# Patient Record
Sex: Male | Born: 1935 | Race: White | Hispanic: No | Marital: Single | Smoking: Former smoker
Health system: Southern US, Community
[De-identification: ages and names within clinical notes are randomized; demographics above are authoritative.]

## PROBLEM LIST (undated history)

## (undated) DIAGNOSIS — I10 Essential (primary) hypertension: Secondary | ICD-10-CM

## (undated) DIAGNOSIS — R609 Edema, unspecified: Secondary | ICD-10-CM

## (undated) DIAGNOSIS — E876 Hypokalemia: Secondary | ICD-10-CM

## (undated) DIAGNOSIS — I639 Cerebral infarction, unspecified: Secondary | ICD-10-CM

## (undated) DIAGNOSIS — I2119 ST elevation (STEMI) myocardial infarction involving other coronary artery of inferior wall: Secondary | ICD-10-CM

## (undated) DIAGNOSIS — E785 Hyperlipidemia, unspecified: Secondary | ICD-10-CM

## (undated) DIAGNOSIS — K7030 Alcoholic cirrhosis of liver without ascites: Secondary | ICD-10-CM

## (undated) DIAGNOSIS — I2510 Atherosclerotic heart disease of native coronary artery without angina pectoris: Secondary | ICD-10-CM

## (undated) HISTORY — DX: Hypokalemia: E87.6

## (undated) HISTORY — DX: ST elevation (STEMI) myocardial infarction involving other coronary artery of inferior wall: I21.19

## (undated) HISTORY — DX: Atherosclerotic heart disease of native coronary artery without angina pectoris: I25.10

## (undated) HISTORY — PX: COLECTOMY: SHX59

## (undated) HISTORY — DX: Cerebral infarction, unspecified: I63.9

## (undated) HISTORY — PX: CHOLECYSTECTOMY: SHX55

## (undated) HISTORY — PX: PENILE PROSTHESIS IMPLANT: SHX240

## (undated) HISTORY — DX: Hyperlipidemia, unspecified: E78.5

## (undated) HISTORY — DX: Essential (primary) hypertension: I10

## (undated) HISTORY — PX: TONSILLECTOMY: SHX5217

## (undated) HISTORY — DX: Edema, unspecified: R60.9

---

## 1999-11-23 ENCOUNTER — Inpatient Hospital Stay (HOSPITAL_COMMUNITY): Admission: EM | Admit: 1999-11-23 | Discharge: 1999-11-30 | Payer: Self-pay | Admitting: Emergency Medicine

## 1999-11-23 ENCOUNTER — Encounter: Payer: Self-pay | Admitting: Cardiology

## 1999-11-25 ENCOUNTER — Encounter: Payer: Self-pay | Admitting: Cardiology

## 1999-12-23 DIAGNOSIS — I2119 ST elevation (STEMI) myocardial infarction involving other coronary artery of inferior wall: Secondary | ICD-10-CM

## 1999-12-23 HISTORY — PX: CARDIAC CATHETERIZATION: SHX172

## 1999-12-23 HISTORY — DX: ST elevation (STEMI) myocardial infarction involving other coronary artery of inferior wall: I21.19

## 2000-01-29 ENCOUNTER — Encounter (HOSPITAL_COMMUNITY): Admission: RE | Admit: 2000-01-29 | Discharge: 2000-04-28 | Payer: Self-pay | Admitting: Cardiology

## 2000-11-12 ENCOUNTER — Ambulatory Visit (HOSPITAL_COMMUNITY): Admission: RE | Admit: 2000-11-12 | Discharge: 2000-11-12 | Payer: Self-pay | Admitting: Urology

## 2000-12-17 ENCOUNTER — Encounter: Payer: Self-pay | Admitting: Urology

## 2000-12-22 ENCOUNTER — Observation Stay (HOSPITAL_COMMUNITY): Admission: RE | Admit: 2000-12-22 | Discharge: 2000-12-23 | Payer: Self-pay | Admitting: Urology

## 2001-02-03 ENCOUNTER — Inpatient Hospital Stay (HOSPITAL_COMMUNITY): Admission: EM | Admit: 2001-02-03 | Discharge: 2001-02-05 | Payer: Self-pay | Admitting: Urology

## 2001-02-03 ENCOUNTER — Encounter: Payer: Self-pay | Admitting: Urology

## 2001-02-07 ENCOUNTER — Ambulatory Visit (HOSPITAL_COMMUNITY): Admission: RE | Admit: 2001-02-07 | Discharge: 2001-02-07 | Payer: Self-pay | Admitting: Urology

## 2001-02-07 ENCOUNTER — Encounter: Payer: Self-pay | Admitting: Urology

## 2001-03-30 ENCOUNTER — Observation Stay (HOSPITAL_COMMUNITY): Admission: RE | Admit: 2001-03-30 | Discharge: 2001-03-31 | Payer: Self-pay | Admitting: Urology

## 2001-05-15 ENCOUNTER — Encounter: Payer: Self-pay | Admitting: Emergency Medicine

## 2001-05-15 ENCOUNTER — Encounter (INDEPENDENT_AMBULATORY_CARE_PROVIDER_SITE_OTHER): Payer: Self-pay | Admitting: *Deleted

## 2001-05-15 ENCOUNTER — Inpatient Hospital Stay (HOSPITAL_COMMUNITY): Admission: EM | Admit: 2001-05-15 | Discharge: 2001-05-19 | Payer: Self-pay | Admitting: Emergency Medicine

## 2001-05-17 ENCOUNTER — Encounter: Payer: Self-pay | Admitting: Cardiology

## 2001-05-18 ENCOUNTER — Encounter: Payer: Self-pay | Admitting: General Surgery

## 2001-11-16 ENCOUNTER — Encounter (INDEPENDENT_AMBULATORY_CARE_PROVIDER_SITE_OTHER): Payer: Self-pay | Admitting: Specialist

## 2001-11-16 ENCOUNTER — Ambulatory Visit (HOSPITAL_BASED_OUTPATIENT_CLINIC_OR_DEPARTMENT_OTHER): Admission: RE | Admit: 2001-11-16 | Discharge: 2001-11-16 | Payer: Self-pay | Admitting: Urology

## 2008-07-09 ENCOUNTER — Inpatient Hospital Stay (HOSPITAL_COMMUNITY): Admission: EM | Admit: 2008-07-09 | Discharge: 2008-07-12 | Payer: Self-pay | Admitting: Emergency Medicine

## 2008-07-11 ENCOUNTER — Encounter (INDEPENDENT_AMBULATORY_CARE_PROVIDER_SITE_OTHER): Payer: Self-pay | Admitting: *Deleted

## 2009-07-11 ENCOUNTER — Inpatient Hospital Stay (HOSPITAL_COMMUNITY): Admission: EM | Admit: 2009-07-11 | Discharge: 2009-07-14 | Payer: Self-pay | Admitting: Emergency Medicine

## 2009-07-12 ENCOUNTER — Ambulatory Visit: Payer: Self-pay | Admitting: Gastroenterology

## 2009-07-13 ENCOUNTER — Encounter (INDEPENDENT_AMBULATORY_CARE_PROVIDER_SITE_OTHER): Payer: Self-pay | Admitting: Internal Medicine

## 2009-07-13 ENCOUNTER — Encounter: Payer: Self-pay | Admitting: Gastroenterology

## 2009-07-20 ENCOUNTER — Encounter (INDEPENDENT_AMBULATORY_CARE_PROVIDER_SITE_OTHER): Payer: Self-pay | Admitting: *Deleted

## 2010-04-16 ENCOUNTER — Ambulatory Visit: Payer: Self-pay | Admitting: Cardiology

## 2010-04-17 ENCOUNTER — Ambulatory Visit: Payer: Self-pay | Admitting: Cardiology

## 2010-07-17 NOTE — Procedures (Signed)
Summary: Colonoscopy  Patient: [name] Note: All result statuses are Final unless otherwise noted.  Tests: (1) Colonoscopy (COL)   COL Colonoscopy           DONE     [facility]     [address]     [place], [place]  [address]           COLONOSCOPY PROCEDURE REPORT           PATIENT:  [name], [name]  MR#:  [id number]     BIRTHDATE:  Jan 25, 1931, 78 yrs. old  GENDER:  male           ENDOSCOPIST:  Judie Petit T. Russella Dar, MD, [facility]     Referred by:  Cassell Clement, M.D.           PROCEDURE DATE:  07/13/2009     PROCEDURE:  Colonoscopy with biopsy     ASA CLASS:  Class II     INDICATIONS:  1) Gastrointestinal hemorrhage  2) abnormal CT of     abdomen           MEDICATIONS:   Fentanyl 75 mcg IV, Versed 7 spray IV           DESCRIPTION OF PROCEDURE:   After the risks benefits and     alternatives of the procedure were thoroughly explained, informed     consent was obtained.  Digital rectal exam was performed and     revealed no abnormalities.  The EC-[id number] ([id number]) endoscope was     introduced through the anus and advanced to the cecum, which was     identified by both the appendix and ileocecal valve, limited by     fair prep.  The quality of the prep was fair, adequate, using     Nulytley.  The instrument was then slowly withdrawn as the colon     was fully examined.     <<PROCEDUREIMAGES>>           FINDINGS:  Colitis was found sigmoid to splenic flexure, It was     segmetnal, severe, ulcerated, friable, exudative, circumferential     and hemorrhagic.  Mild diverticulosis was found in the sigmoid     colon. This was otherwise a normal examination of the colon.     Retroflexed views in the rectum revealed no abnormalities.  The     time to cecum =  3  minutes. The scope was then withdrawn (time =     11  min) from the patient and the procedure completed.           COMPLICATIONS:  None           ENDOSCOPIC IMPRESSION:     1) Severe left sided  segmnetal colitis     2) Mild diverticulosis in the sigmoid colon           RECOMMENDATIONS:     1) Await pathology results           Jameelah Watts T. Russella Dar, MD, [facility]           n.     eSIGNED:   Venita Lick. Anarie Kalish at 07/13/2009 02:24 PM           [place], [id number]  Note: An exclamation mark (!) indicates a result that was not dispersed into the flowsheet. Document Creation Date: 07/13/2009 2:50 PM _______________________________________________________________________  (1) Order result status: Final Collection or observation date-time: 07/13/2009 14:18 Requested date-time:  Receipt date-time:  Reported date-time:  Referring Physician:   Ordering Physician: Claudette Head [phone]) Specimen Source:  Source: Launa Grill Order Number: [id number] Lab site:

## 2010-07-17 NOTE — Letter (Signed)
Summary: New Patient letter  [facility]  [address] [place], [place] [address]   Phone: [phone]  Fax: [phone]       07/20/2009 MRN: [id number]  [name] [address] [place], [place]  [address]  [place]  Dear Mr. PASK,  Welcome to the Gastroenterology Division at [facility].    You are scheduled to see Dr.  Claudette Head on August 21, 2009 at 9:30am on the 3rd floor at [facility], [address]  We ask that you try to arrive at our office 15 minutes prior to your appointment time to allow for check-in.  We would like you to complete the enclosed self-administered evaluation form prior to your visit and bring it with you on the day of your appointment.  We will review it with you.  Also, please bring a complete list of all your medications or, if you prefer, bring the medication bottles and we will list them.  Please bring your insurance card so that we may make a copy of it.  If your insurance requires a referral to see a specialist, please bring your referral form from your primary care physician.  Co-payments are due at the time of your visit and may be paid by cash, check or credit card.     Your office visit will consist of a consult with your physician (includes a physical exam), any laboratory testing he/she may order, scheduling of any necessary diagnostic testing (e.g. x-ray, ultrasound, CT-scan), and scheduling of a procedure (e.g. Endoscopy, Colonoscopy) if required.  Please allow enough time on your schedule to allow for any/all of these possibilities.    If you cannot keep your appointment, please call [phone] to cancel or reschedule prior to your appointment date.  This allows [place] the opportunity to schedule an appointment for another patient in need of care.  If you do not cancel or reschedule by 5 p.m. the business day prior to your appointment date, you will be charged a $50.00 late cancellation/no-show fee.    Thank  you for choosing [facility] for your medical needs.  We appreciate the opportunity to care for you.  Please visit [place] at our website  to learn more about our practice.                     Sincerely,                                                             The Gastroenterology Division

## 2010-08-17 ENCOUNTER — Ambulatory Visit (INDEPENDENT_AMBULATORY_CARE_PROVIDER_SITE_OTHER): Payer: Medicare Other | Admitting: Cardiology

## 2010-08-17 DIAGNOSIS — I119 Hypertensive heart disease without heart failure: Secondary | ICD-10-CM

## 2010-08-17 DIAGNOSIS — E119 Type 2 diabetes mellitus without complications: Secondary | ICD-10-CM

## 2010-08-17 DIAGNOSIS — Z79899 Other long term (current) drug therapy: Secondary | ICD-10-CM

## 2010-09-02 LAB — GLUCOSE, CAPILLARY
Glucose-Capillary: 110 mg/dL — ABNORMAL HIGH (ref 70–99)
Glucose-Capillary: 111 mg/dL — ABNORMAL HIGH (ref 70–99)
Glucose-Capillary: 132 mg/dL — ABNORMAL HIGH (ref 70–99)
Glucose-Capillary: 141 mg/dL — ABNORMAL HIGH (ref 70–99)
Glucose-Capillary: 145 mg/dL — ABNORMAL HIGH (ref 70–99)
Glucose-Capillary: 154 mg/dL — ABNORMAL HIGH (ref 70–99)
Glucose-Capillary: 157 mg/dL — ABNORMAL HIGH (ref 70–99)
Glucose-Capillary: 163 mg/dL — ABNORMAL HIGH (ref 70–99)
Glucose-Capillary: 170 mg/dL — ABNORMAL HIGH (ref 70–99)

## 2010-09-02 LAB — CARDIAC PANEL(CRET KIN+CKTOT+MB+TROPI)
CK, MB: 3.1 ng/mL (ref 0.3–4.0)
Total CK: 76 U/L (ref 7–232)
Total CK: 81 U/L (ref 7–232)
Troponin I: 0.28 ng/mL — ABNORMAL HIGH (ref 0.00–0.06)

## 2010-09-02 LAB — URINALYSIS, ROUTINE W REFLEX MICROSCOPIC
Leukocytes, UA: NEGATIVE
Nitrite: NEGATIVE
Protein, ur: NEGATIVE mg/dL
Urobilinogen, UA: 1 mg/dL (ref 0.0–1.0)

## 2010-09-02 LAB — DIFFERENTIAL
Basophils Absolute: 0 10*3/uL (ref 0.0–0.1)
Basophils Relative: 0 % (ref 0–1)
Blasts: 0 %
Eosinophils Absolute: 0 10*3/uL (ref 0.0–0.7)
Eosinophils Relative: 0 % (ref 0–5)
Eosinophils Relative: 1 % (ref 0–5)
Lymphocytes Relative: 15 % (ref 12–46)
Lymphocytes Relative: 19 % (ref 12–46)
Lymphs Abs: 3.5 10*3/uL (ref 0.7–4.0)
Metamyelocytes Relative: 0 %
Monocytes Absolute: 1.7 10*3/uL — ABNORMAL HIGH (ref 0.1–1.0)
Monocytes Absolute: 1.7 10*3/uL — ABNORMAL HIGH (ref 0.1–1.0)
Monocytes Absolute: 2.2 10*3/uL — ABNORMAL HIGH (ref 0.1–1.0)
Monocytes Relative: 8 % (ref 3–12)
Monocytes Relative: 9 % (ref 3–12)
Neutro Abs: 14.9 10*3/uL — ABNORMAL HIGH (ref 1.7–7.7)
Neutrophils Relative %: 79 % — ABNORMAL HIGH (ref 43–77)
WBC Morphology: INCREASED
nRBC: 0 /100 WBC

## 2010-09-02 LAB — CBC
HCT: 33.6 % — ABNORMAL LOW (ref 39.0–52.0)
HCT: 34.9 % — ABNORMAL LOW (ref 39.0–52.0)
HCT: 37.1 % — ABNORMAL LOW (ref 39.0–52.0)
HCT: 38.9 % — ABNORMAL LOW (ref 39.0–52.0)
Hemoglobin: 11.3 g/dL — ABNORMAL LOW (ref 13.0–17.0)
Hemoglobin: 11.4 g/dL — ABNORMAL LOW (ref 13.0–17.0)
Hemoglobin: 11.7 g/dL — ABNORMAL LOW (ref 13.0–17.0)
Hemoglobin: 12.4 g/dL — ABNORMAL LOW (ref 13.0–17.0)
MCHC: 33.5 g/dL (ref 30.0–36.0)
MCHC: 34.4 g/dL (ref 30.0–36.0)
MCV: 87.3 fL (ref 78.0–100.0)
Platelets: 258 10*3/uL (ref 150–400)
Platelets: 274 10*3/uL (ref 150–400)
Platelets: 278 10*3/uL (ref 150–400)
RBC: 3.99 MIL/uL — ABNORMAL LOW (ref 4.22–5.81)
RDW: 16.2 % — ABNORMAL HIGH (ref 11.5–15.5)
RDW: 16.2 % — ABNORMAL HIGH (ref 11.5–15.5)
RDW: 16.4 % — ABNORMAL HIGH (ref 11.5–15.5)
RDW: 16.6 % — ABNORMAL HIGH (ref 11.5–15.5)
WBC: 18.6 10*3/uL — ABNORMAL HIGH (ref 4.0–10.5)
WBC: 28.1 10*3/uL — ABNORMAL HIGH (ref 4.0–10.5)

## 2010-09-02 LAB — BASIC METABOLIC PANEL
BUN: 12 mg/dL (ref 6–23)
CO2: 27 mEq/L (ref 19–32)
Calcium: 8.4 mg/dL (ref 8.4–10.5)
Creatinine, Ser: 1.23 mg/dL (ref 0.4–1.5)
GFR calc non Af Amer: 54 mL/min — ABNORMAL LOW (ref >60)
Glucose, Bld: 100 mg/dL — ABNORMAL HIGH (ref 70–99)
Glucose, Bld: 152 mg/dL — ABNORMAL HIGH (ref 70–99)
Potassium: 2.8 mEq/L — ABNORMAL LOW (ref 3.5–5.1)
Sodium: 139 mEq/L (ref 135–145)

## 2010-09-02 LAB — URINE CULTURE

## 2010-09-02 LAB — COMPREHENSIVE METABOLIC PANEL
AST: 24 U/L (ref 0–37)
BUN: 29 mg/dL — ABNORMAL HIGH (ref 6–23)
CO2: 24 mEq/L (ref 19–32)
Calcium: 8.8 mg/dL (ref 8.4–10.5)
Chloride: 98 mEq/L (ref 96–112)
Creatinine, Ser: 1.73 mg/dL — ABNORMAL HIGH (ref 0.4–1.5)

## 2010-09-02 LAB — LIPASE, BLOOD: Lipase: 15 U/L (ref 11–59)

## 2010-09-02 LAB — CULTURE, BLOOD (ROUTINE X 2)

## 2010-09-02 LAB — URINE MICROSCOPIC-ADD ON

## 2010-09-02 LAB — AMYLASE: Amylase: 38 U/L (ref 0–105)

## 2010-09-02 LAB — POCT CARDIAC MARKERS
Myoglobin, poc: 192 ng/mL (ref 12–200)
Myoglobin, poc: 220 ng/mL (ref 12–200)

## 2010-09-02 LAB — STOOL CULTURE

## 2010-09-02 LAB — CLOSTRIDIUM DIFFICILE EIA

## 2010-09-02 LAB — BRAIN NATRIURETIC PEPTIDE: Pro B Natriuretic peptide (BNP): 717 pg/mL — ABNORMAL HIGH (ref 0.0–100.0)

## 2010-09-02 LAB — GIARDIA/CRYPTOSPORIDIUM SCREEN(EIA)

## 2010-09-02 LAB — TSH: TSH: 1.965 u[IU]/mL (ref 0.350–4.500)

## 2010-09-02 LAB — MAGNESIUM: Magnesium: 1.9 mg/dL (ref 1.5–2.5)

## 2010-09-02 LAB — FECAL LACTOFERRIN, QUANT

## 2010-09-02 LAB — HEMOCCULT GUIAC POC 1CARD (OFFICE): Fecal Occult Bld: POSITIVE

## 2010-09-14 ENCOUNTER — Other Ambulatory Visit: Payer: Self-pay | Admitting: *Deleted

## 2010-09-14 DIAGNOSIS — I2510 Atherosclerotic heart disease of native coronary artery without angina pectoris: Secondary | ICD-10-CM

## 2010-09-14 MED ORDER — ISOSORBIDE MONONITRATE ER 60 MG PO TB24: 60.0000 mg | ORAL_TABLET | ORAL | Status: DC

## 2010-10-01 LAB — CBC
HCT: 42.1 % (ref 39.0–52.0)
Hemoglobin: 12.1 g/dL — ABNORMAL LOW (ref 13.0–17.0)
Hemoglobin: 13 g/dL (ref 13.0–17.0)
Hemoglobin: 13.9 g/dL (ref 13.0–17.0)
MCHC: 32.3 g/dL (ref 30.0–36.0)
MCHC: 33.2 g/dL (ref 30.0–36.0)
MCV: 84.3 fL (ref 78.0–100.0)
MCV: 85.2 fL (ref 78.0–100.0)
MCV: 86 fL (ref 78.0–100.0)
Platelets: 292 10*3/uL (ref 150–400)
RBC: 4.37 MIL/uL (ref 4.22–5.81)
RBC: 4.63 MIL/uL (ref 4.22–5.81)
RBC: 4.72 MIL/uL (ref 4.22–5.81)
RDW: 15.2 % (ref 11.5–15.5)
RDW: 15.7 % — ABNORMAL HIGH (ref 11.5–15.5)
WBC: 9.5 10*3/uL (ref 4.0–10.5)

## 2010-10-01 LAB — BASIC METABOLIC PANEL
BUN: 12 mg/dL (ref 6–23)
CO2: 28 mEq/L (ref 19–32)
Calcium: 8.3 mg/dL — ABNORMAL LOW (ref 8.4–10.5)
Calcium: 8.4 mg/dL (ref 8.4–10.5)
Calcium: 9.1 mg/dL (ref 8.4–10.5)
Chloride: 106 mEq/L (ref 96–112)
Creatinine, Ser: 1.07 mg/dL (ref 0.4–1.5)
GFR calc Af Amer: >60 mL/min (ref >60)
GFR calc Af Amer: >60 mL/min (ref >60)
GFR calc non Af Amer: 52 mL/min — ABNORMAL LOW (ref >60)
GFR calc non Af Amer: >60 mL/min (ref >60)
Glucose, Bld: 144 mg/dL — ABNORMAL HIGH (ref 70–99)
Potassium: 4 mEq/L (ref 3.5–5.1)
Sodium: 140 mEq/L (ref 135–145)
Sodium: 141 mEq/L (ref 135–145)

## 2010-10-01 LAB — CARDIAC PANEL(CRET KIN+CKTOT+MB+TROPI)
Relative Index: INVALID (ref 0.0–2.5)
Total CK: 55 U/L (ref 7–232)
Troponin I: 0.02 ng/mL (ref 0.00–0.06)
Troponin I: 0.04 ng/mL (ref 0.00–0.06)

## 2010-10-01 LAB — URINALYSIS, ROUTINE W REFLEX MICROSCOPIC
Bilirubin Urine: NEGATIVE
Glucose, UA: NEGATIVE mg/dL
Ketones, ur: NEGATIVE mg/dL
Protein, ur: NEGATIVE mg/dL

## 2010-10-01 LAB — GLUCOSE, CAPILLARY
Glucose-Capillary: 159 mg/dL — ABNORMAL HIGH (ref 70–99)
Glucose-Capillary: 162 mg/dL — ABNORMAL HIGH (ref 70–99)
Glucose-Capillary: 164 mg/dL — ABNORMAL HIGH (ref 70–99)
Glucose-Capillary: 251 mg/dL — ABNORMAL HIGH (ref 70–99)
Glucose-Capillary: 98 mg/dL (ref 70–99)

## 2010-10-01 LAB — TROPONIN I: Troponin I: 0.03 ng/mL (ref 0.00–0.06)

## 2010-10-01 LAB — POCT I-STAT, CHEM 8
BUN: 14 mg/dL (ref 6–23)
Calcium, Ion: 1.09 mmol/L — ABNORMAL LOW (ref 1.12–1.32)
Chloride: 105 mEq/L (ref 96–112)
Creatinine, Ser: 1.1 mg/dL (ref 0.4–1.5)
Glucose, Bld: 151 mg/dL — ABNORMAL HIGH (ref 70–99)
HCT: 45 % (ref 39.0–52.0)
Potassium: 3.8 mEq/L (ref 3.5–5.1)

## 2010-10-01 LAB — APTT: aPTT: 34 seconds (ref 24–37)

## 2010-10-01 LAB — HEPATIC FUNCTION PANEL
ALT: 13 U/L (ref 0–53)
AST: 15 U/L (ref 0–37)
Albumin: 3 g/dL — ABNORMAL LOW (ref 3.5–5.2)
Alkaline Phosphatase: 53 U/L (ref 39–117)
Total Bilirubin: 0.7 mg/dL (ref 0.3–1.2)

## 2010-10-01 LAB — DIFFERENTIAL
Eosinophils Absolute: 0.2 10*3/uL (ref 0.0–0.7)
Eosinophils Relative: 2 % (ref 0–5)
Lymphs Abs: 2.6 10*3/uL (ref 0.7–4.0)
Monocytes Absolute: 0.9 10*3/uL (ref 0.1–1.0)

## 2010-10-01 LAB — LIPID PANEL
HDL: 33 mg/dL — ABNORMAL LOW (ref >39)
Triglycerides: 144 mg/dL (ref <150)
VLDL: 29 mg/dL (ref 0–40)

## 2010-10-01 LAB — CK TOTAL AND CKMB (NOT AT ARMC)
CK, MB: 1.8 ng/mL (ref 0.3–4.0)
Relative Index: INVALID (ref 0.0–2.5)
Total CK: 50 U/L (ref 7–232)

## 2010-10-01 LAB — HEMOGLOBIN A1C: Hgb A1c MFr Bld: 7.8 % — ABNORMAL HIGH (ref 4.6–6.1)

## 2010-10-24 ENCOUNTER — Other Ambulatory Visit: Payer: Self-pay | Admitting: *Deleted

## 2010-10-24 DIAGNOSIS — I10 Essential (primary) hypertension: Secondary | ICD-10-CM

## 2010-10-24 MED ORDER — AMLODIPINE BESYLATE 5 MG PO TABS: 5.0000 mg | ORAL_TABLET | Freq: Every day | ORAL | Status: DC

## 2010-10-24 NOTE — Telephone Encounter (Signed)
Refilled meds per fax request.  

## 2010-10-30 NOTE — Consult Note (Signed)
NAMEMarland Reed  ANTRON, SETH NO.:  [id number]   MEDICAL RECORD NO.:  [id number]          PATIENT TYPE:  INP   LOCATION:  3025                         FACILITY:  [facility]   PHYSICIAN:  Noel Christmas, MD    DATE OF BIRTH:  [birth year]   DATE OF CONSULTATION:  DATE OF DISCHARGE:                                 CONSULTATION   REFERRING PHYSICIAN:  Elise Benne, MD   REASON FOR CONSULTATION:  Subacute stroke.   HISTORY:  This is a 77 year old male who developed acute onset of speech  difficulty as well as weakness involving his right extremities,  primarily his arm and hand on July 07, 2008.  The patient did not  seek medical attention until today, however.  He indicated that his  speech has improved slightly and his right upper extremity has improved  much more so than speech.  No previous history of stroke or TIA.  CT of  his head showed left posterior frontal subacute appearing ischemic  lesion consistent with subacute stroke.  There was no evidence of acute  hemorrhage.  The patient has been on Plavix 75 mg a day and aspirin 324  mg per day for coronary artery disease.  His pro-time was 14.1, his INR  was 1.  PTT was 34.   PAST MEDICAL HISTORY:  Remarkable for:  1. Hypertension.  2. Hyperlipidemia.  3. Type 2 diabetes mellitus.  4. The patient has a history of coronary artery disease and status      post myocardial infraction.   CURRENT MEDICATIONS:  1. Actos 30 mg per day.  2. Altace 10 mg per day.  3. Amlodipine 10 mg per day.  4. Fexofenadine 60 mg per day.  5. Humulin 70/30.  6. Hydrochlorothiazide 25 mg per day.  7. Isosorbide mononitrate 60 mg once a day.  8. Lantus 12 units once a day.  9. Lopressor 10 mg once a day.  10.Lovastatin 10 mg once a day.  11.Plavix 75 mg per day.  12.The patient also has been on aspirin 324 mg per day.   FAMILY HISTORY:  Positive for heart disease involving both sides of his  family.  Family history is otherwise  unremarkable except for diabetes  mellitus affecting 1 brother.   PHYSICAL EXAMINATION:  GENERAL:  Appearance is that of an elderly man of  medium build who was alert and cooperative, in no acute distress.  He  was well oriented to time as well as place.  His short-term and long-  term memory were normal.  Affect was appropriate.  He had fairly  frequent brief word-finding difficulty.  He also made fairly frequent  paraphasic errors.  There was no receptive aphasia.  HEENT:  Pupils were equal, reactive normally to light.  Extraocular  movements were fully conjugate.  Visual fields were intact and normal.  There was no fascial numbness.  He had mild right lower facial weakness.  Hearing was normal.  Speech was minimally dysarthric.  Palatal movement  was symmetrical.  EXTREMITIES:  Motor exam showed mild weakness of his right upper  extremity proximally and mild-to-moderate weakness  distally including  hand grip.  He had mild hip flexor weakness on the right.  Right lower  extremity was otherwise normal.  Strength of his left extremity was  normal.  Muscle tone was normal throughout.  Deep tendon reflexes were  1+ at the upper extremities, brisk at the knees, and absent at the  ankles.  Plantar responses were flexor.  Sensory exam was normal except  for reduced vibratory sensation distally in his feet.  Carotid  auscultation was normal.   CLINICAL IMPRESSION:  Subacute left posterior frontal cerebral ischemic  infarction with residual mild-to-moderate expressive aphasia as well as  residual mild hemiparesis on the right.   RECOMMENDATIONS:  1. Continue Plavix and aspirin daily.  2. MRI and MRA of the brain as planned.  3. Carotid Doppler study on July 11, 2008.  4. Echocardiogram if not done recently.  5. Physical therapy, occupational therapy, and speech therapy      consults.   Thank you for asking me to evaluate Mr. Miler.      Noel Christmas, MD  Electronically  Signed     CS/MEDQ  D:  07/09/2008  T:  07/10/2008  Job:  [id number]

## 2010-10-30 NOTE — Discharge Summary (Signed)
[name], [name]             ACCOUNT NO.:  [id number]   MEDICAL RECORD NO.:  [id number]          PATIENT TYPE:  INP   LOCATION:  3025                         FACILITY:  [facility]   PHYSICIAN:  Peggye Pitt, M.D. DATE OF BIRTH:  09-27-1930   DATE OF ADMISSION:  07/09/2008  DATE OF DISCHARGE:  07/12/2008                               DISCHARGE SUMMARY   DISCHARGE DIAGNOSES:  1. 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  2. Hypertension.  3. Type 2 diabetes mellitus.  4. Hyperlipidemia.   DISCHARGE MEDICATIONS:  1. Zocor 40 mg daily at bedtime.  2. Altace 20 mg daily.  3. Lantus 30 units subcu at bedtime.  4. Actos 30 mg daily.  5. Amlodipine 10 mg daily.  6. Hydrochlorothiazide 25 mg daily.  7. Plavix 75 mg daily.  8. Fexofenadine 60 mg daily.  9. Metoprolol 100 mg daily.  10.Imdur 60 mg daily.   DISPOSITION AND FOLLOW UP:  The patient is discharged home in stable  condition.  He will follow up with his primary care physician, Dr.  Patty Reed.  At that time, his hypertension should be addressed and  further antihypertensives added if needed.  Also please note that his  statin dose has been increased and he should need a repeat fasting lipid  profile as well as a repeat CMET in about 4-6 weeks to follow up on his  liver function tests.   CONSULTATION THIS HOSPITALIZATION:  Dr. Pearlean Brownie with Neurology for his  acute CVA.   IMAGES AND PROCEDURES THIS HOSPITALIZATION:  1. Chest x-ray on July 09, 2008 with borderline cardiomegaly and no      active disease.  2. A CT head without contrast on July 09, 2008 that shows areas of      probable subacute infarction in the left posterior frontal lobe.      Atrophy.  Chronic small vessel white matter ischemic changes and      old infarcts.  3. An MRI, MRA of the brain without contrast on July 10, 2008 that      shows an acute left MCA territory infarct affecting the left      posterior frontal and temporal  regions.  Atrophy and small vessel      disease of multiple old infarcts.  He also had no visible proximal      stenosis.  Missing left MCA branch distal to the trifurcation on      the left correlating with the observed pattern of infarction.  4. He had a 2-D echo on July 11, 2008 that showed an ejection      fraction of 60% with no left ventricular regional wall motion      abnormalities.  Left ventricular wall thickness was moderately      increased with moderate asymmetric septal hypertrophy.  Doppler      parameters consistent with abnormal left ventricular relaxation.      There was moderate aortic valve stenosis with a transaortic valve      gradient of 12 mmHg with an estimated aortic valve area of 0.99      cm2.  The patient also had carotid Dopplers on July 11, 2008      that showed a right 60-80% ICA stenosis, on the left no significant      ICA stenosis with bilateral ECA stenosis and vertebral flow was      antegrade bilaterally.   HISTORY AND PHYSICAL EXAMINATION:  For full details, please refer to  history and physical dictated by Dr. Lovell Sheehan on July 10, 2008 but in  brief, [name] is a 77 year old Caucasian man with history of high  blood pressure, high cholesterol and diabetes who presented to the  emergency department after noting a right facial droop and right arm  weakness which was noted in the morning by his family.  He was last seen  normal the night before.  Because of possibility of a stroke, he was  brought into the hospital for further evaluation and management.   HOSPITAL COURSE:  1. Acute left MCA infarct.  The routine stroke workup was obtained      including a 2-D echo which showed evidence of aortic stenosis and      diastolic dysfunction, however, no cardiac source of embolus was      identified.  He did have incidental 60-80% right ICA stenosis on      his carotid Dopplers, however, that would not correlate with his      symptoms or his  CVA.  The goal at this point is continued      aggressive risk factor modification.  He is already on Plavix.      Physical Therapy and Occupational Therapy have decided that he has      no further needs.  Speech Therapy has recommended that he have      outpatient speech therapy followup so we will arrange this prior to      his discharge.  2. Hypertension.  Initially, we did not try to aggressively control      his blood pressure given his acute stroke, however, on day prior to      discharge his blood pressure was noted to be quite elevated in the      160-180 range, so we have proceeded to increase his Altace to 20 mg      daily and this could be further followed up as an outpatient.  He      will likely also need to have his renal function monitored on the      increased dose of angiotensin receptor blocker.  3. For his type 2 diabetes mellitus, he is on orals as well as Lantus      insulin.  His Lantus was increased from 12-20 units and he will      continue this regimen until advised otherwise by his primary care      physician.  He has maintained moderate CBG control on that regimen.  4. For his hyperlipidemia, a fasting lipid profile in the hospital is      as follows.  Total cholesterol 157, triglycerides 144, HDL 33, LDL      95.  Given his acute CVA, his goal LDL should be below 70, so his      dose of Zocor was increased from 20-40 mg and hence he will need a      repeat liver function test as well as a repeat fasting lipid      profile in about 4-6 weeks.  5. Rest of chronic medical issues were not a problem this  hospitalization.   VITAL SIGNS UPON DISCHARGE:  Blood pressure 160/83, heart rate 73,  respirations 18, O2 sats 95% on room air with a temperature of 97.8.   LABORATORY DATA ON DAY OF DISCHARGE:  Sodium 141, potassium 4.0,  chloride 106, bicarb 28, BUN 12, creatinine 1.25, glucose of 88.  WBCs  9.4, hemoglobin 13.0 and platelets of 239.      Peggye Pitt, M.D.  Electronically Signed     EH/MEDQ  D:  07/12/2008  T:  07/13/2008  Job:  [id number]   cc:   Cassell Clement, M.D.  Pramod P. Pearlean Brownie, MD

## 2010-10-30 NOTE — H&P (Signed)
[name], [name]             ACCOUNT NO.:  [id number]   MEDICAL RECORD NO.:  [id number]          PATIENT TYPE:  INP   LOCATION:  3025                         FACILITY:  [facility]   PHYSICIAN:  Della Goo, M.D. DATE OF BIRTH:  05-Jan-1931   DATE OF ADMISSION:  07/09/2008  DATE OF DISCHARGE:                              HISTORY & PHYSICAL   This is an unassigned patient.   CHIEF COMPLAINT:  Left-sided facial droop and right arm weakness.   HISTORY OF PRESENT ILLNESS:  This is a 77 year old male who was brought  to the emergency department after suffering symptoms of facial drooping  and right arm weakness which was noticed in the a.m. by his family.  The  patient states that his symptoms may have started in the evening before  he went to bed when he was reading a book, and his right arm drops and  became numb, and he was unable to lift his arm.  The patient denied  having any headache or any chest pain.  In the morning his family  noticed that he was drooling from one side of his mouth and had  difficulty speaking and had a facial droop.  The patient's son states  that the patient had been having increased confusion over the past few  days.  The patient currently denies any discomfort but states that his  right arm is still weak.   PAST MEDICAL HISTORY:  Significant for hypertension, type 2 diabetes  mellitus, hyperlipidemia.   PAST SURGICAL HISTORY:  History of a cholecystectomy, appendectomy,  history of a cardiac catheterization with PTCA with stent placement in  2001 and history of a TURP.   MEDICATIONS:  Medications include amlodipine, Altace Actos, Lantus  insulin 12 units subcu daily. The patient also takes Humulin 70/30. This  dosage needs to be further clarified.  Fexofenadine, Imdur,  hydrochlorothiazide, lovastatin and Plavix.   ALLERGIES:  No known drug allergies.   SOCIAL HISTORY:  The patient is married.  He is a current nonsmoker,  nondrinker.   FAMILY  HISTORY:  Noncontributory.   PHYSICAL EXAMINATION FINDINGS:  GENERAL APPEARANCE:  This is an older-  than-stated-age-appearing, 77 year old, well-nourished, well-developed  male in no discomfort or acute distress currently.  VITAL SIGNS:  Temperature 97.6, blood pressure 176/89, heart rate 69,  respirations 20, O2 saturation 98-100%.  HEENT: Normocephalic, atraumatic.  There is a left-sided facial droop.  Pupils are reactive to light bilaterally.  There is no scleral icterus  or scleral injection or erythema.  Extraocular movements are intact at  this time.  The funduscopic benign.  Oropharynx: The patient does have  mild tongue deviation to the right. Otherwise oropharynx is clear.  There is no exudate or erythema.  NECK: Supple with full range of motion.  No thyromegaly, adenopathy, or  jugular venous distention.  CARDIOVASCULAR: Regular rate and rhythm.  No murmurs, gallops or rubs.  LUNGS: Clear to auscultation bilaterally.  ABDOMEN: Positive bowel sounds, soft, nontender, nondistended.  No  hepatosplenomegaly.  EXTREMITIES: Without cyanosis, clubbing or edema.  NEUROLOGIC:  The patient is alert and oriented x3.  His speech  is mildly  dysarthric.  There is left-sided facial drooping and left-sided tongue  deviation. And there is right upper extremity weakness. His strength in  the right arm is a 4/5 at this time compared to 5/5 of the left upper  extremity.  Both lower extremities have full range of motion and 5/5  strength.  Babinski signs are equivocal in both feet.   LABORATORY STUDIES:  White blood cell count 9.5, hemoglobin 13.9, MCV  85.2, platelets 282, neutrophils 60% lymphocytes 28%.  Ammonia level  less than 8.  Urinalysis negative.  Sodium 140, potassium 3.8, chloride  105, BUN 14, creatinine 1.1 and glucose 151, bicarb 27.  Chest x-ray  findings are negative for any acute disease process.  CT scan of the  head negative for any acute intracranial hemorrhage.  Of note,  the  patient has diffusely enlarged ventricles, and old bilateral basal  ganglia lacunar infarcts are present.   ASSESSMENT:  A 77 year old male being admitted with:  1. Cerebrovascular accident.  2. Uncontrolled hypertension.  3. Type 2 diabetes mellitus.  4. Hyperlipidemia.   PLAN:  The patient will be admitted to a telemetry area for cardiac  monitoring.  A CVA workup will be started.  Cardiac enzymes will also be  performed.Marland Kitchen  An MRI/MRA study will be ordered along with carotid  ultrasound studies and a 2-D echo.  The patient's regular medications  will be further reviewed and continued. Sliding scale insulin coverage  will be added as well for elevated blood sugars.  The patient will be  placed on neurologic checks, and for now the patient will be placed on  DVT and GI prophylaxis.  Further workup will ensue pending results of  the patient's studies.      Della Goo, M.D.  Electronically Signed     HJ/MEDQ  D:  07/10/2008  T:  07/10/2008  Job:  [id number]

## 2010-11-02 NOTE — Discharge Summary (Signed)
[place]. [facility]  Reed:    [name], [name]                    MRN: [id number] Adm. Date:  [id number] Disc. Date: [id number] Attending:  Rudean Hitt                           Discharge Summary  FINAL DIAGNOSES: 1. Acute inferolateral myocardial infarction. 2. Coronary atherosclerosis. 3. Tobacco use disorder. 4. Status post percutaneous transluminal coronary angioplasty. 5. Hypertrophic cardiomyopathy. 6. Benign prostatic hypertrophy. 7. Diabetes mellitus.  OPERATION PERFORMED:  Emergency cardiac catheterization by Alvia Grove., M.D., on November 23, 1999, with multiple vessel angioplasty and stent of Mathew right coronary artery.  HISTORY OF PRESENT ILLNESS:  [name] is a 77 year old gentleman admitted as an emergency by Dr. Delane Ginger with acute infralateral myocardial infarction.  He has had known coronary artery disease.  He had PTCA of his LAD in 1993.  He has done fairly well over Mathew past several years but yesterday he presented to Mathew office with a week long history of chest discomfort relieved with Advil.  This morning at 4 a.m. [name] developed severe chest pain. At 4 a.m. he presented to [facility]w emergency room with acute ST segment changes of an inferolateral wall myocardial infarction associated with dyspnea and weakness.  MEDICATIONS AT ADMISSION:  These include Hytrin, Ecotrin, Lopressor, nitroglycerin and Centrum Silver.  ADMISSION PHYSICAL EXAMINATION:  VITAL SIGNS:  Heart rate 120, blood pressure 180/110, respiratory rate 20.  NECK:  He has 2+ carotids with no bruits.  LUNGS:  His lungs are clear.  CARDIOVASCULAR:  There was regular rhythm.  He has a grade 1/6 systolic ejection murmur at Mathew left sternal border. There is no S3 gallop.  ABDOMEN:  His abdomen is soft and nontender.  EXTREMITIES:  There are 2+ pedal pulses, no phlebitis or edema.  NEUROLOGICAL:  His examination is normal.  HOSPITAL  COURSE:  [name] presents with an acute inferolateral myocardial infarction.  He was taken emergently to [facility]w cardiac catheterization lab by Dr. Elease Hashimoto where catheterization and successful PTCA and stenting of a right coronary artery was employed.  [name] tolerated catheterization well.  He was placed on Integrelin post catheterization.  By Mathew following morning he was essentially pain free except for some mild pain on extreme deep breathing. His electrocardiogram showed an evolving inferior wall myocardial infarction with persistent ST elevation in Mathew anterior V leads.  Rhythm remained stable. CK-MBs and troponins were elevated, consistent with significant myocardial damage.  Blood sugars were markedly elevated at greater than 400.  [name] has a family history of diabetes and had had recent increased nocturia and polydipsia.  He was begun on Actos and a sliding scale insulin.  By Mathew morning after admission his blood pressure was down to normal at 120/60, pulse was 88, lungs were clear and there was no evidence of pericarditis or congestive heart failure.  By November 25, 1999, however, there was some evidence of cardiomegaly and pulmonary vascular congestion on x-ray.  There was apparent LV dysfunction.  Altace was added to his regimen.  He was also placed on low dose Lasix.  His IV nitroglycerin was stopped and he was placed on Imdur.  He was put on Humulin 70/30 and had diabetic instruction while in [facility] hospital.  He was continued on Lovenox.  He did develop a low grade  fever and some pleuritic chest pain consistent with post MI pericarditis and Lovenox was held.  His EKG continues to show persistent ST elevation in Mathew inferior leads.  A 2D echo showed inferior wall hypokinesis but overall good LV function and no intracardiac masses or thrombi.  There was no apparent pericardial disease noted.  At that point we increased his Altace further and also restarted Plavix.  Blood  pressure remained high despite Lopressor and Altace, Lasix and Imdur.  Lasix was switched to hydrochlorothiazide for better blood pressure control and Norvasc was also added to his regimen.  He was noted to be mildly anemic with hematocrit of 29, hemoglobin 10.1 and iron was added to his regimen as well.  By November 30, 1999, he was strong enough to be discharged to home improved.  At Mathew time of discharge his lungs were clear, rhythm was regular and cardiac examination showed no S3 gallop and no pericardial friction rub.  DISCHARGE MEDICATIONS:  1. Ferrous sulfate 325 one daily.  2. Therapeutic multivitamin with folic acid one daily.  3. Norvasc 5 mg daily.  4. Hydrochlorothiazide 12.5 mg daily.  5. Humulin 70/30 26 units each morning.  6. Altace 10 mg daily.  7. Plavix 75 mg daily.  8. K-Dur 20 mg daily.  9. Generic Imdur 60 mg daily. 10. Ecotrin 81 mg daily. 11. Actos 30 mg daily. 12. Metoprolol 100 mg twice a day. 13. Hytrin 5 mg at bedtime. 14. Nitrostat 1/150 p.r.n.  ACTIVITIES:  He is to walk as tolerated.  DIET:  He will be on a low sugar diabetic diet.  FOLLOW-UP:  He will be enrolled in [facility] phase II cardiac rehab program.  He will see Maisie Fus A. Brackbill, M.D., in one week for office visit, EKG, CBC, CMET nonfasting.  CONDITION ON DISCHARGE:  Improved. DD:  01/03/00 TD:  01/07/00 Job: [id number]

## 2010-11-02 NOTE — Op Note (Signed)
[place]. [facility]  Patient:    KAYLUM, SHRUM                      MRN: [id number] Proc. Date: 09/17/00 Attending:  Vonzell Schlatter. Patsi Sears, M.D.                           Operative Report  PREOPERATIVE DIAGNOSIS:  Erectile dysfunction.  POSTOPERATIVE DIAGNOSIS:  Erectile dysfunction.  PROCEDURE:  Injection of prostaglandin in [facility] for evaluation and treatment purposes.  DESCRIPTION OF PROCEDURE:  The resting presystolic rate of flow is 24.6 cc/sec. on the right side and 15.8 cc/sec. on the left side.  The end-diastolic flow on the right side is low at 4.0, and on the left the end-diastolic pressure is 0.  After injection of 10 mcg/cc of prostaglandin, the penile _____ on the right rises to 83.0 cc/sec. flow five minutes post injection and a peak of 34.9 cc/sec. flow on the left.  The end-diastolic flow on the right is somewhat elevated at 24.7 at the five minute mark but drops off to 6.4 cc/sec. at the 15 minute mark.  IMPRESSION:  The patient has good arterial inflow, but he may have some venous outflow problems. DD:  11/20/00 TD:  11/21/00 Job: [id number]

## 2010-11-02 NOTE — Op Note (Signed)
[place]. [facility]  Patient:    FRANKO, HILLIKER Visit Number: [id number] MRN: [id number]          Service Type: MED Location: [id number] Attending Physician:  Rudean Hitt Dictated by:   Lorne Skeens Hoxworth, M.D. Proc. Date: 05/18/01 Admit Date:  05/15/2001                             Operative Report  PREOPERATIVE DIAGNOSIS:  Cholelithiasis and cholecystitis.  POSTOPERATIVE DIAGNOSIS:  Cholelithiasis and cholecystitis.  PROCEDURE:  Laparoscopic cholecystectomy with intraoperative cholangiogram.  SURGEON:  Sharlet Salina T. Hoxworth, M.D.  ASSISTANT:  Ashutosh Coffin. Samuella Cota, M.D.  ANESTHESIA:  General.  HISTORY OF PRESENT ILLNESS:  Mr. Carswell is a 77 year old white male who presented with acute anterior chest and epigastric pain radiating to both flanks, and transient elevated liver function tests that have returned towards normal.  Gallbladder ultrasound shows stones and a somewhat thickened gallbladder wall.  HIDA scan was non-vis.  Laparoscopic cholecystectomy and cholangiogram has been recommended and accepted.  Ashby Dawes of the procedure, indications, risks of bleeding, infection, bile leak, bowel retention, and possible need for open procedure were discussed and understood preoperatively. Now brought to the operating room for this procedure.  DESCRIPTION OF PROCEDURE:  The patient is brought to the operating room and placed in supine position on the operating room table, and general endotracheal anesthesia was induced.  He was given antibiotics preoperatively. PAs were placed.  The abdomen was sterilely prepped and draped.  Local anesthesia was used to infiltrate the trocar sites prior to the incision.  A 1 cm incision was made at the umbilicus, dissection carried down to the midline fascia which was sharply incised for 1 cm.  The peritoneum entered under direct vision.  Through a mattress suture of 0 Vicryl, the Hasson trocar was  placed, and pneumoperitoneum established.  Under direct vision, two 5 mm trocars were placed along the right subcostal margin.  The gallbladder was visualized.  It was shrunken, firm, and showed evidence of severe chronic inflammation and thickening.  With some difficulty, the fundus was grasped and elevated.  Some fibrofatty tissue was striped down off the gallbladder towards the portahepatus.  The gallbladder appeared to taper down, and then immediately widen again.  Dissection was carried down into the plane where the gallbladder appeared to narrow, and actually what appeared to be the infundibulum initially was the common bile duct.  This was not dissected.  The plane between the very small shrunken gallbladder and what appeared to be the common bile duct was carefully developed, and the gallbladder was very adherent to this.  Eventually, this was able to be safely dissected free, and an extremely short cystic duct just 1 or 2 mm was able to be encircled. Operative cholangiograms were obtained through this short cystic duct making the opening up toward the gallbladder.  This showed filling of a slightly prominent common bile duct and intrahepatic ducts with free flow into the duodenum and no filling defects.  Following this, the catheter was removed, and a single secure clip was able to be placed on the extremely short cystic duct.  Anterior posterior branches of the cystic artery were seen just beyond this posteriorly, and these were clamped and clipped and controlled.  The gallbladder was then dissected free from its bed using hook cautery. Dissection was very tedious as there was again a lot of chronic inflammation and scarring.  The gallbladder was quite tiny, and it seemed to contain essentially one large stone.  It was placed in an endocatch bag and removed. There was some bleeding in the gallbladder bed and this was controlled with cautery plus a Surgicel pack with complete  hemostasis.  The right upper quadrant was thoroughly irrigated and inspected for hemostasis.  Due to the single clip on the cystic duct in the raw bed, I elected to leave a closed suction drain.  A 19 round Blake was left in the subhepatic space and brought out through one of the lateral trocar sites.  Operative site was again inspected for hemostasis.  Trocars were removed under direct vision.  All CO2 evacuated from the abdominal cavity.  Mattress suture was secured to the umbilicus.  Skin incisions were closed with interrupted subcuticular 4-0 Monocryl and Steri-Strips.  Sponge, needle, and instrument counts were correct.  Dry sterile dressing was applied.  The patient was taken to recovery in good condition. Dictated by:   Lorne Skeens. Hoxworth, M.D. Attending Physician:  Rudean Hitt DD:  05/18/01 TD:  05/18/01 Job: [id number]

## 2010-11-02 NOTE — Cardiovascular Report (Signed)
[place]. [facility]  Patient:    AWS, SHERE                    MRN: [id number] Proc. Date: 12/23/99 Adm. Date:  [id number] Disc. Date: [id number] Attending:  Rudean Hitt CC:         Colleen Can. Deborah Chalk, M.D.                        Cardiac Catheterization  HISTORY:  Mr. Bordon is a 77 year old gentleman with a history of coronary artery disease.  He presents with acute onset of chest pain with an EKG consistent with an inferior wall myocardial infarction.  He was brought to the catheterization lab emergently.  PROCEDURE:  Left heart catheterization with percutaneous transluminal coronary angioplasty and stenting of the right coronary artery.  PROCEDURAL NOTE:  The right femoral artery was easily cannulated with an 8-French sheath.  HEMODYNAMICS:  The left ventricular pressure was 138/21.  The aortic pressure was 141/78.  ANGIOGRAPHY: 1. The left main coronary artery was relatively smooth and normal. 2. The left anterior descending artery had an eccentric 60% stenosis in the    proximal segment.  The mid LAD had diffuse irregularities between 50-60%.    The distal LAD had moderate irregularities. 3. The first diagonal branch was a fairly large vessel.  It had severe diffuse    disease in the proximal 30 mm.  The disease severity ranged between 80-90%.    The second diagonal vessel is a moderate sized vessel.  There is an 80-90%    stenosis in the proximal aspect of this vessel. 4. The left circumflex artery is a fairly small to moderate sized vessel.  It    bifurcates fairly early.  There are 80-90% stenosis in the proximal aspect    of this vessel. 5. The right coronary artery is a large and dominant vessel.  The proximal    aspect has a 30-40% stenosis on the first bend.  The mid RCA has a sub    total occlusion associated with a large thrombus.  The degree of stenosis    is approximately 99%.  There is severe diffuse disease in  the posterior    descending artery and moderate to severe disease in the posterolateral    segment artery.  The right coronary artery was engaged using a Judkins right #4 sidehole guide. The patient had been given 5000 units of heparin in the emergency room and an additional 3900 units of heparin were given.  The patient was given a double bolus Integrilin drip.  A traverse 0.014 angioplasty wire was used to cross the stenoses.  This was followed by a 4.0 x 20-mm Maverick balloon.  The Maverick was inflated twice:  three atmospheres for 21 seconds, followed by six atmospheres for 47 seconds.  This resulted in a marked improvement of flow but with an obvious dissection.  There was also some propagation of the thrombus distally down the posterior descending artery.  A 4.0 x 23-mm Tetra stent was positioned across the stenosis.  It was deployed at 12 atmospheres for 75 seconds.  This resulted in a markedly improved vessel lumen.  There was TIMI-3 flow down the main vessel and out the posterior lateral segment artery. There was an obvious embolus to the distal aspect of the posterior descending artery.  The traverse guide wire was used to push the thromboembolus down the PDA until it became  lodged in the very terminal part of the PDA.  Over time, the flow through the PDA improved.  No balloon inflations were needed down in the distal aspect of the posterior descending artery.  The patients symptoms improved.  He will be continued on Integrilin for the next 18 hours.  The left ventriculogram was performed following the angioplasty.  It reveals a mildly dilated left ventricle.  The overall left ventricular systolic function is well preserved with an EF of around 60%.  There is moderate to severe hypokinesis involving the inferior base and distal inferior wall.  There is trace mitral regurgitation.  COMPLICATIONS:  None.  CONCLUSIONS: 1. Successful PTCA and stenting of the mid right coronary  artery. 2. Well preserved left ventricular systolic function but with segmental wall    motion abnormality consistent with the inferior wall myocardial infarction. 3. The patient has severe three-vessel coronary artery disease including a    severe disease involving the right coronary artery.  Despite this    successful PTCA and stent procedure, the patient will quite likely need to    have bypass surgery.  We will discuss these results with Dr. Deborah Chalk. DD: 01/11/00 TD:  01/11/00 Job: [id number]

## 2010-11-02 NOTE — H&P (Signed)
[place]. [facility]  Patient:    [name], [name] Visit Number: [id number] MRN: [id number]          Service Type: MED Location: CCUB [id number] [id number] Attending Physician:  Rudean Hitt Dictated by:   Darden Palmer., M.D. Admit Date:  05/15/2001 Disc. Date: 05/15/01   CC:         Maisie Fus A. Patty Sermons, M.D.   History and Physical  REASON FOR ADMISSION:  Chest pain.  HISTORY OF PRESENT ILLNESS:  The patient is a 77 year old male with a known history of diabetes mellitus and coronary artery disease and hypertension, who presents to the hospital with unstable angina.  The patient has a history of having had an angioplasty in 1993, and has had some episodic chest pain over the years.  He was admitted on November 23, 1999, with an acute inferior infarction, and underwent an angioplasty by Dr. Vesta Mixer, Montez Hageman., with stenting of the right coronary artery with a 4.0 mm x 23.0 mm MultiLink Tetra stent.  He did well following this.  There is a question in the chart as to whether he should have bypass grafting down the road or not.  He has also had significant diabetes mellitus.  He has been followed in the office and had done well without much in the way of recurrent chest pain over the years, but it had only been moderately active.  Around 12 weeks ago, he had laser surgery of his prostate, and eventually had implantation of a penile implant.  This later had to be removed, because of infection, and he had repeat surgery about six weeks ago.  He has had continued difficulty with getting his prosthesis to go down, which he thinks is his inability to work his device properly.  He awoke this morning around 5 a.m. with the onset of an anterior tight feeling, and thought he might have indigestion.  He took Gaviscon without relief, and antacids without relief.  He then took two old dated nitroglycerin without relief.  He became pale and sweaty and  diaphoretic.  EMS was called.  He received two additional nitroglycerin with relief of his symptoms.  He was brought to the emergency room.  He is admitted at this time for treatment of unstable angina.  PAST MEDICAL HISTORY 1. Remarkable for type 2 diabetes mellitus. 2. He has erectile dysfunction. 3. He has a prior history of hypertension. 4. He has mild hyperlipidemia. 5. History of a hiatal hernia.  No known renal disease.  He has no known retinopathy that he is aware of.  PAST SURGICAL HISTORY 1. He had war injuries in the [place] war. 2. He has had an appendectomy. 3. Two hernia repairs. 4. Right ear surgery. 5. A nasal surgery.  ALLERGIES:  No known drug allergies.  CURRENT MEDICATIONS 1. Plavix 75 mg q.d. 2. Altace 10 mg q.d. 3. Lopressor 100 q.a.m., 100 mg q. evening, 50 mg q. lunch. 4. Actos 30 mg q.d. 5. HCTZ 12.5 mg q.d. 6. Imdur 60 mg q.d. 7. Zocor 10 mg q.d. 8. Humulin 70/30, 26 units q.d. 9. Iron 300 mg b.i.d.  FAMILY HISTORY:  Unchanged from old records that are reviewed.  SOCIAL HISTORY:  The patient smoked, but quit back in the 1990s.  He does not currently use alcohol to excess.  He drinks only socially.  He is retired from [employer] and worked for [employer] for many years.  His wife was raised in [place].  He  has five children.  REVIEW OF SYSTEMS:  He is very hard of hearing and wears a hearing aid in his left ear.  He does not have any history of diabetic eye disease.  He has no difficulty with vision.  He has not had any abnormal weight loss.  There has been no difficulty swallowing.  He does have occasional mild indigestion.  He has had genitourinary problems as noted above.  He has no claudication. No TIAs.  Other than as noted above, the remainder of the review of systems is unremarkable.  PHYSICAL EXAMINATION  GENERAL:  He is an elderly male who is very hard of hearing.  VITAL SIGNS:  Blood pressure currently 175/90, pulse currently  80.  SKIN:  Warm and dry with no obvious mass or lesions.  HEENT:  EOMI.  PERRLA.  C&S clear.  Fundi unremarkable.  No definite diabetic retinopathy noted.  He is very hard of hearing.  Pharynx negative.  NECK:  Supple, without masses.  No jugular venous distention, thyromegaly, or carotid bruits.  LUNGS:  Clear to A&P.  CARDIAC:  Normal S1, S2.  A 1/6 systolic ejection murmur, no S3.  ABDOMEN:  Soft, nontender.  No masses, no organomegaly.  PULSES:  Femoral pulses 2+.  Peripheral pulses 1+.  EXTREMITIES:  There is 1+ edema noted.  GENITOURINARY:  Reveals an erection, which the patient attributes to a partially-deflated prosthesis.  RECTAL:  Deferred.  NEUROLOGIC:  Normal.  NODES:  The lymph nodes were normal.  A 12-lead electrocardiogram revealed a normal sinus rhythm, nonspecific ST-T wave abnormality.  A chest x-ray shows clear lung fields.  IMPRESSION 1. Unstable angina. 2. Coronary artery disease with    a. Stenting of the right coronary artery, complicated by thrombus of       the posterior descending artery on November 23, 1999.    b. A previous angioplasty of the left anterior descending coronary       artery in 1993, with a 60% lesion noted at a catheterization at       that time. 2. Type 2 diabetes mellitus, insulin-dependent. 3. Erectile dysfunction with recent genitourinary surgery. 4. Hypertension, not well-controlled. 5. History of benign prostatic hypertrophy of the prostate. 6. Hyperlipidemia.  RECOMMENDATION:  The patient will be admitted to rule out a myocardial infarction.  He will be placed on Norvasc, IV nitroglycerin, and heparin.  He will likely need to have a repeat cardiac catheterization to assess his cardiac condition. Dictated by:   Darden Palmer., M.D. Attending Physician:  Rudean Hitt DD:  05/15/01 TD:  05/15/01 Job: [id number]

## 2010-11-02 NOTE — Op Note (Signed)
[name]  Patient:    [name], [name] Visit Number: [id number] MRN: [id number]          Service Type: NES Location: NESC Attending Physician:  Laqueta Jean Dictated by:   Vonzell Schlatter Patsi Sears, M.D. Proc. Date: 11/16/01 Admit Date:  11/16/2001                             Operative Report  PREOPERATIVE DIAGNOSIS:  Bladder outlet obstruction.  POSTOPERATIVE DIAGNOSIS:  Bladder outlet obstruction.  OPERATION PERFORMED:  Cystourethroscopy, transurethral incision of the prostate, transurethral resection of the prostate.  SURGEON:  Sigmund I. Patsi Sears, M.D.  ANESTHESIA:  General (LMA).  PREPARATION:  After appropriate preanesthesia, the patient was brought to the operating room and placed on the operating table in dorsal supine position where general LMA anesthesia was introduced. He was then replaced in the dorsal lithotomy position where the pubis was prepped with Betadine solution and draped in the usual fashion.  HISTORY OF PRESENT ILLNESS:  This 77 year old diabetic with heart disease, has significant bladder outlet symptoms, with spraying of his urine. Cystoscopy showed that the patient has previously had TUR, and has a significant amount of regrowth of gland, with a large bar of tissue from the veru, to the right lateral prostate, with a large amount of previous resection underneath the trigone. He is now for lateral lobe resection, and incision of the bar from the lateral prostate to the veru.  DESCRIPTION OF PROCEDURE:  Cystourethroscopy was accomplished, and shows the above mentioned prostatic findings with trabeculation and early cellule formation. A Collins knife is used to incise the bar of tissue, connecting the right lateral prostate and the veru. Resection is accomplished of the lateral lobes of the prostate, and the bladder neck is incised, and connected with the large previously resected subtrigonal tissue.  Following this, all tissue was coagulated, and a 24 Ainsworth catheter is placed after irrigation of the chips free from bladder. They were sent to the laboratory for evaluation. The patient had a B&O suppository, IV Toradol and was awakened and taken to the recovery room in good condition. Dictated by:   Vonzell Schlatter Patsi Sears, M.D. Attending Physician:  Laqueta Jean DD:  11/16/01 TD:  11/17/01 Job: [id number]

## 2010-11-02 NOTE — Discharge Summary (Signed)
[facility]  Patient:    [name], KNECHTEL Visit Number: [id number] MRN: [id number]          Service Type: MED Location: [id number] Attending Physician:  Rudean Hitt Dictated by:   Clovis Pu Patty Sermons, M.D. Admit Date:  05/15/2001 Discharge Date: 05/19/2001   CC:         Sharlet Salina T. Hoxworth, M.D.  Sigmund I. Patsi Sears, M.D.   Discharge Summary  FINAL DIAGNOSES: 1. Cholecystectomy and cholecystitis. 2. Abdominal pain secondary to #1. 3. Coronary artery disease with prior inferior wall myocardial infarction    June 2001 treated with a stent to the right coronary artery. 4. Diabetes mellitus. 5. Status post insertion of penile prosthesis complicated by infection. 6. Hyperlipidemia. 7. Hypertensive cardiovascular disease.  OPERATIONS PERFORMED:  Cholecystectomy by Dr. Glenna Fellows on May 18, 2001.  This was a laparoscopic cholecystectomy with intraoperative cholangiogram by Dr. Johna Sheriff assisted by Dr. Rozetta Nunnery.  HISTORY:  This 77 year old gentleman was admitted as an emergency by Dr. Denny Aho on May 15, 2001 because of chest pain and epigastric pain.  He has a history of known coronary disease.  He had an angioplasty in 1993 and then presented November 23, 1999 with an acute inferior wall infarction and underwent angioplasty of his right coronary artery by Dr. Deloris Ping. Nahser with stenting. Since then he has done well.  He has had significant diabetes mellitus and has had hypertension.  Approximately 12 weeks prior to this admission he had laser surgery of his prostate and eventually had implantation of a penile implant for problems with erectile dysfunction.  The implant subsequently had to be removed because of infection and he had repeat surgery about six weeks ago.  On the morning of admission, he awoke at 5 a.m. with the onset of anterior chest tightness and he thought it might be indigestion,  took Gaviscon without relief.  He also tried some outdated nitroglycerin with no relief and then became pale, sweaty and diaphoretic.  EMS was called and he was brought to the emergency room where he had relief of symptoms with two additional nitroglycerin.  PHYSICAL EXAMINATION:  Showed a blood pressure of 175/90, pulse 80.  HEENT: Negative.  He is somewhat hard of hearing.  Thyroid is not enlarged.  The chest is clear.  The heart reveals a soft systolic ejection murmur.  No S3. Abdomen is soft and nontender.  There are no masses.  Femoral pulses are 2+. Peripheral pulses 1+.  There is 1+ peripheral edema.  The genitourinary exam reveals a partially deflated penile prosthesis in place.  The chest x-ray showed clear lungs.  EKG shows only nonspecific ST-T wave changes.  HOSPITAL COURSE:  The patient was admitted to rule out an MI.  He was placed on Norvasc, IV nitroglycerin and heparin.  It was felt that he might need repeat cardiac catheterization.  By the day after surgery, he was clinically improved but was complaining of abdominal soreness.  Liver enzymes were noted to be up and bilirubin was slightly up.  Cardiac exam was unremarkable. Serial enzymes had ruled out an MI but the abnormal liver enzymes lead to evaluation of his gallbladder.  He underwent a gallbladder ultrasound and a HIDA scan and the ultrasound showed gallstones with thickened wall and the HIDA scan showed nonvisualization of the gallbladder.  It was felt that this represented clinically active gallbladder disease and Dr. Johna Sheriff was consulted.  Dr. Johna Sheriff advised surgery.  He was cleared  from the cardiac standpoint and then underwent laparoscopic cholecystectomy with intraoperative cholangiogram on May 18, 2001.  He had no complications and was able to be discharged the following day.  DISCHARGE MEDICATIONS:  He was advised to continue his home cardiac, blood pressure and diabetic medication.  He was  given Vicodin or Tylenol to be used for pain.  WOUND CARE:  He was advised on wound care by the surgeon.  FOLLOW-UP:  He will see Dr. Johna Sheriff in the office in two weeks.  He will see Dr. Patty Sermons in the office in two weeks.  CONDITION ON DISCHARGE:  Improved.  At the time of discharge, his liver function studies were improving. Dictated by:   Clovis Pu Patty Sermons, M.D. Attending Physician:  Rudean Hitt DD:  05/27/01 TD:  05/28/01 Job: [id number]

## 2010-11-02 NOTE — Op Note (Signed)
[facility]  Patient:    [name], [name] Visit Number: [id number] MRN: [id number]          Service Type: SUR Location: 3W [id number] 01 Attending Physician:  Lurene Shadow. Date: 03/30/01 Admit Date:  03/30/2001   CC:         Maisie Fus A. Patty Sermons, M.D.   Operative Report  PREOPERATIVE DIAGNOSIS:  Infected penile prosthesis.  POSTOPERATIVE DIAGNOSIS:  Infected penile prosthesis.  OPERATION:  Explantation of penile prosthesis, Mulcahy penile prosthesis salvage protocol, replacement of penile prosthesis.  SURGEON:  Sigmund I. Patsi Sears, M.D.  ANESTHESIA:  General endotracheal.  PREPARATION:  After appropriate preanesthesia, the patient was brought to the operating room, placed on the operating table in the dorsal supine position where general endotracheal anesthesia was introduced.  The wound was washed with Betadine solution, shaven, prepped with Betadine solution, and draped in the usual fashion.  REVIEW OF HISTORY:  This 77 year old male diabetic, has a history of traumatic Peyronies disease with erectile failure.  He is status post implantation of Mentor three piece inflatable penile prosthesis on December 22, 2000.  The patient was doing well at his three week postoperative follow-up, but at six weeks, he appeared to have scrotal swelling, erythema, and pain.  He was given antibiotics immediately and admitted to the hospital for IV antibiotics.  He improved with this, but had chronic pain which was felt to represent chronic infection.  He is now for explantation of prosthesis with Mulcahy protocol and replacement of the prosthesis.  PAST MEDICAL HISTORY: 1. Diabetes. 2. Arteriosclerotic vascular disease with a stent. 3. History of BPH, status post transurethral needle ablation in 2002. 4. Tonsillectomy. 5. Appendectomy.  ALLERGIES:  None known.  HABITS:  Tobacco and alcohol none.  MEDICATIONS: 1. Plavix 75 mg q.d. 2. Actos  30 mg q.d. 3. Altace 10 mg q.d. 4. Hydrochlorothiazide 25 mg 1/2 q.d. 5. Isosorbide 60 mg q.d. 6. Hytrin 5 mg a.m. and p.m. and 1/2 tablet at noon. 7. Zocor 10 mg per day. 8. Nitroglycerin p.r.n.  DESCRIPTION OF PROCEDURE:  Incision is made through the old incision and subcutaneous tissue dissected.  The prosthesis was easily identified and dissected free from the corpora cavernous bilaterally.  The pump is removed from the scrotum, and the cylinders removed, and the reservoir removed as well.  This was placed in a bag awaiting pickup by the [employer].  Using the [facility] protocol, irrigation was accomplished with antibiotic solution, peroxide, Betadine solution.  Following strict guidelines of the protocol, this was accomplished.  Following this, replacement of prosthesis was accomplished with a 14 cm cylinder and 3 cm ______ extension.  Following this, the corpus cavernosa were closed with 2-0 Vicryl suture with care taken to avoid any injury to the cylinders.  The reservoir was replaced and closure of the reservoir fascia was accomplished with 3-0 Vicryl.  Following this, the wound was closed with ______ tubing underneath, paracardial pieces, 2 x 7 and 4 x 7.  Following this, the subcu was closed with 3-0 Vicryl suture, and the skin was closed with 4-0 Vicryl running suture.  Sterile dressing was applied.  The patient was awakened and taken to the recovery room in good condition. Attending Physician:  Laqueta Jean DD:  03/30/01 TD:  03/30/01 Job: [phone]

## 2010-11-02 NOTE — Op Note (Signed)
[facility]  Patient:    [name], [name]                      MRN: [id number] Proc. Date: 12/22/00 Attending:  Vonzell Schlatter. Patsi Sears, M.D. CC:         Clovis Pu. Patty Sermons, M.D.   Operative Report  PREOPERATIVE DIAGNOSIS:  Organic sexual dysfunction, Peyronie disease, diabetes.  POSTOPERATIVE DIAGNOSIS:  Organic sexual dysfunction, Peyronie disease, diabetes.  OPERATION PERFORMED:  Implantation of Mentor penile prosthesis (14 cm plus 2 cm rear tip extension).  SURGEON:  Sigmund I. Patsi Sears, M.D.  ANESTHESIA:  General endotracheal.  PREPARATION:  After appropriate preanesthesia, the patient was brought to the operating room and placed on the operating table in the dorsal supine position where general endotracheal anesthesia was introduced.  The pubis was washed with Betadine solution, prepped with Betadine solution after washing for 10 minutes, then shaving, prepped with Betadine solution and draped in the usual fashion.  A Foley catheter was inserted in the bladder and a semilunar incision was made around the base of the penis and subcutaneous tissues, dissecting with the electrosurgical unit.  The midline vascular structures are protected, and the corpora cavernosa identified bilaterally.  Two separate 2-0 Vicryl sutured were then used as stay sutures on either side of the corpora cavernosa and bilateral corporotomies were made.  The corporotomies were dilated, and the patient measured 14 cm on each side total, with the posterior portion of the incision measuring to the 7 cm mark.  REVIEW OF HISTORY:  This 78 year old diabetic with a history of traumatically induced Peyronie disease, has erection failure and also a history of cardiovascular disease, tonsillectomy, and TUNA procedure in 2002 for BPH.  He is now for placement of penile prosthesis (November 20, 2000).  DESCRIPTION OF PROCEDURE:  Following corporal dilation, the superpubic  fascia was dissected with the electrosurgical unit, with blunt and sharp dissection. A 75 cc reservoir was placed in the tissue underneath the abdominal wall musculature and fascia, and the fascia closed.  85 cc was placed in the reservoir and there was 2 cc of back pressure only.  Following this, the corporal cylinders were then placed, and these were cycled and had excellent position underneath the corpora spongiosum of the glans. Following this, the pump was placed in the scrotum with excellent positioning of the pump in the right hemiscrotum.  Bleeding was noted from minor blood vessels and this was electrocoagulated.  The wound was copiously irrigated with antibiotic irrigation.  The corpora cavernosa were then closed with running 2-0 Vicryl suture.  Following this the wound was again irrigated and closed in multiple layers with 3-0 Vicryl suture.  The skin was closed with skin staples.  Sterile dressing was applied. A light Coban dressing was applied.  The patient was awakened and taken to the recovery room in good condition.  Marcaine was injected in the patients wound. DD:  12/22/00 TD:  12/22/00 Job: [id number]

## 2010-11-02 NOTE — H&P (Signed)
[facility]  Patient:    [name], [name]                    MRN: [id number] Adm. Date:  [id number] Attending:  Rudean Hitt CC:         Thomas A. Patty Sermons, M.D.                         History and Physical  HISTORY:  Mr. Lundberg is a 77 year old gentleman with a history of coronary artery disease and cigarette smoking.  He presents with an acute inferolateral wall myocardial infarction.  Mr. Petrovic had known history of coronary artery disease.  He had PTCA of his left anterior descending artery back in 1993.  He has done fairly well over the past several years.  Yesterday, he presented to the office with a week-long history of some chest discomfort.  He has not had any severe pain. The chest tightness and chest soreness was relieved with Advil.  The patient developed severe chest pain this morning around 4 a.m.  He presented to the emergency room at mid morning and was found to have ST segment changes consistent with an inferolateral wall myocardial infarction.  The patient is similarly short of breath.  CURRENT MEDICATIONS: 1. Hytrin 5 mg in the morning with 2 mg in the evening. 2. Ecotrin 2 a day. 3. Lopressor 100 mg twice a day. 4. Nitroglycerin as needed. 5. Centrum Silver once a day.  ALLERGIES:  No known drug allergies.  PAST MEDICAL HISTORY:  1. Coronary artery disease.  2. History of syncope. 3. BPH.  SOCIAL HISTORY:  The patient has a remote history of smoking.  He does not drink alcohol to excess.  FAMILY HISTORY:  Noncontributory.  REVIEW OF SYSTEMS:  Was reviewed and is essentially negative.  PHYSICAL EXAMINATION:  GENERAL:  He is a middle-aged white gentleman in no acute distress.  VITAL SIGNS:  His heart rate is 120, blood pressure 180/110.  His respiratory rate is 20.  HEENT:  Reveals 2+ carotids with no bruits.  LUNGS:  Clear to auscultation.  BACK:  Nontender.  HEART:  Regular rate, S1 and S2.   He has a 1/6 systolic ejection murmur at the left sternal border.  He has no S3 gallop.  ABDOMEN:  Reveals good bowel sounds and is nontender.  He has no hepatosplenomegaly and no masses or bruits.  EXTREMITIES:  He has 2+ pulses bilaterally.  He has no clubbing, cyanosis, or edema.  NEUROLOGIC:  Reveals cranial nerves 2-12 are intact and his motor and sensory function are intact.  His gait was not assessed.  GENITOURINARY:  Deferred.  LABORATORY DATA:  EKG reveals acute ST segment elevation in leads 2, 3, and AVF, as well as leads V5 and V6.  This is clearly changed from his EKG yesterday.  His laboratory data is pending.  IMPRESSION:  Mr. Surrette presents with an acute inferolateral myocardial infarction.  He may have had a stuttering MI this week, which explains some of his chest pains.  We will take him emergently to the heart catheterization. We discussed the risks, benefits, and options of heart catheterization.  He understands and agrees to proceed.  DD:  11/23/99 TD:  11/24/99 Job: [id number]

## 2010-11-02 NOTE — H&P (Signed)
[facility]  Patient:    [name], [name] Visit Number: [id number] MRN: [id number]          Service Type: MED Location: 3W [phone] Attending Physician:  Laqueta Jean Adm. Date:  02/03/2001   CC:         [name], M.D.   History and Physical  HISTORY OF PRESENT ILLNESS:  [name] is a 77 year old white male diabetic, with a history of traumatic Peyronie disease, with erectile failure. He is status post implantation of Mentor three-piece inflatable penile prosthesis on December 22, 2000.  The patient was recently seen for his three-week postoperative followup, and was doing quite well.  He had scrotal swelling at that time, but decrease in pain, and appeared to be improving well postoperatively.  However, on his six-week postoperative checkup, the patient has noted one week of scrotal swelling and pain.  He has had no fevers or chills and no gross hematuria.  Examination shows erythematous swelling in the right hemiscrotum, with inflammatory response over the tubing and pump area.  The remaining penis and scrotum and wound are within normal limits.  He is admitted for IV antibiotics and evaluation.  In the office, the patient appears to have vagal type reaction, with profuse sweating.  Concern for urosepsis is also present.  PAST MEDICAL HISTORY:  1. Diabetes.  2. Arteriosclerotic vascular disease with stent.  3. History of benign prostatic hypertrophy, status post transurethral needle     ablation in 2002.  4. History of tonsillectomy.  5. History of appendectomy.  ALLERGIES:  No known drug allergies.  HABITS:  Tobacco and alcohol are none.  MEDICATIONS:  1. Plavix.  2. Actos.  3. Adalat.  4. Dyazide.  5. Isosorbide.  6. Hytrin (on hold).  7. Zocor.  8. Nitrostat.  9. Allegra. 10. Insulin. 11. Multivitamins.  PHYSICAL EXAMINATION:  VITAL SIGNS:  Temperature 94.4 in the office, with pulse 60, respiratory  rate 80, and blood pressure 90/58.  GENERAL:  Thin, white male in obvious distress.  The patient is ashen gray in color, sweating profusely, having difficulty walking with tenderness in the right hemiscrotum.  HEENT:  Pupils equal, round, and reactive to light and accommodation.  EOM full.  NECK:  Supple, nontender, and no nodes.  LUNGS:  Clear to P&A.  ABDOMEN:  Soft, positive bowel sounds without organomegaly or masses.  GENITALIA:  There is erythematous, hard swelling in the right hemiscrotum over the prosthesis pump and tubing.  The penis is normal and the incision is well-healed.  EXTREMITIES:  Without cyanosis or edema.  NEUROLOGIC:  Physiologic.  ADMITTING IMPRESSION:  Probably infected right inflatable prosthesis pump in a diabetic.  PLAN:  Admit for IV antibiotics and diabetes control. Attending Physician:  Laqueta Jean DD:  02/04/01 TD:  02/04/01 Job: [id number]

## 2010-11-02 NOTE — Discharge Summary (Signed)
[facility]  Patient:    [name], [name] Visit Number: [id number] MRN: [id number]          Service Type: MED Location: 3W [phone] Attending Physician:  Laqueta Jean Dictated by:   Vonzell Schlatter Patsi Sears, M.D. Adm. Date:  02/03/2001 Disc. Date: 02/05/2001                             Discharge Summary  DISCHARGE DIAGNOSES: 1. Scrotal penile prosthesis infection. 2. Diabetes. 3. Arteriosclerotic vascular disease with stent. 4. History of benign prostatic hypertrophy, status post transurethral needle    ablation of the prostate. 5. History of tonsillectomy. 6. History of appendectomy.  PROCEDURES:  None.  HISTORY OF PRESENT ILLNESS:  [name] is a 77 year old, white male diabetic with a history of traumatic Peyronie disease with erectile failure. He is status post implantation of Mentor III inflatable penile prosthesis on December 22, 2000.  The patient was seen for his three-week postoperative followup and was doing quite well.  However, one week later, he developed some increased scrotal swelling and pain and became concerned when the pain increased.  He was seen in the office and felt to have probable infection of the pump of the prosthesis and was admitted to the hospital for IV antibiotics.  PAST MEDICAL HISTORY:  As noted above.  ALLERGIES:  No known drug allergies.  HABITS:  No tobacco and alcohol  MEDICATIONS:  1. Plavix.  2. Actos.  3. Adalat.  4. Dyazide.  5. Isosorbide.  6. Hytrin (on hold).  7. Zocor.  8. Nitrostat.  9. Allegra. 10. Insulin. 11. Multivitamins.  PHYSICAL EXAMINATION:  VITAL SIGNS:  Temperature 94.4 in the office, pulse 60, respiratory rate 80, blood pressure 90/58.  The remaining physical examination is as noted on the H&P of August 20.  LABORATORY DATA AND X-RAY FINDINGS:  Urinalysis with no white cells, no red cells.  Urine culture showed no growth.  Admission white blood cell count  is 8000 with hemoglobin 12.6 and hematocrit 37.0.  His platelet count was 361,000.  Admission sodium 137, potassium 3.8, chloride 104, CO2 27, BUN 18, creatinine 1.6.  Liver functions are normal.  HOSPITAL COURSE:  On the day of admission, the patient was started on IV Ancef and IV gentamicin.  His inflamed, hardened scrotum immediately improved with decrease in his pain requiring no pain medication.  The patient is now allowed to be discharged on Levaquin p.o. as well as IV gentamicin per home health. He will receive an 8 a.m. and 8 p.m. dose per pharmacy protocol, 160 mg IV q.12h.  CONDITION ON DISCHARGE:  He is discharged in improved condition. Dictated by:   Vonzell Schlatter Patsi Sears, M.D. Attending Physician:  Laqueta Jean DD:  02/05/01 TD:  02/06/01 Job: [id number]

## 2010-11-05 ENCOUNTER — Other Ambulatory Visit: Payer: Self-pay | Admitting: *Deleted

## 2010-11-05 DIAGNOSIS — I119 Hypertensive heart disease without heart failure: Secondary | ICD-10-CM

## 2010-11-05 MED ORDER — HYDROCHLOROTHIAZIDE 12.5 MG PO CAPS: 12.5000 mg | ORAL_CAPSULE | Freq: Every day | ORAL | Status: DC

## 2010-11-05 MED ORDER — RAMIPRIL 10 MG PO CAPS: 10.0000 mg | ORAL_CAPSULE | Freq: Every day | ORAL | Status: DC

## 2010-11-05 NOTE — Telephone Encounter (Signed)
Refilled meds per fax request.  

## 2010-11-16 ENCOUNTER — Ambulatory Visit: Payer: Medicare Other | Admitting: Cardiology

## 2010-11-16 ENCOUNTER — Other Ambulatory Visit: Payer: Medicare Other | Admitting: *Deleted

## 2010-12-13 ENCOUNTER — Other Ambulatory Visit: Payer: Self-pay | Admitting: Cardiology

## 2010-12-13 NOTE — Telephone Encounter (Signed)
Med refill

## 2010-12-25 ENCOUNTER — Other Ambulatory Visit: Payer: Self-pay | Admitting: *Deleted

## 2010-12-25 DIAGNOSIS — E119 Type 2 diabetes mellitus without complications: Secondary | ICD-10-CM

## 2010-12-25 DIAGNOSIS — E785 Hyperlipidemia, unspecified: Secondary | ICD-10-CM

## 2010-12-28 ENCOUNTER — Encounter: Payer: Self-pay | Admitting: *Deleted

## 2011-01-03 ENCOUNTER — Encounter: Payer: Self-pay | Admitting: Cardiology

## 2011-01-03 ENCOUNTER — Other Ambulatory Visit (INDEPENDENT_AMBULATORY_CARE_PROVIDER_SITE_OTHER): Payer: Medicare Other | Admitting: *Deleted

## 2011-01-03 ENCOUNTER — Ambulatory Visit (INDEPENDENT_AMBULATORY_CARE_PROVIDER_SITE_OTHER): Payer: Medicare Other | Admitting: Cardiology

## 2011-01-03 VITALS — BP 120/80 | HR 72 | Wt 182.0 lb

## 2011-01-03 DIAGNOSIS — E785 Hyperlipidemia, unspecified: Secondary | ICD-10-CM

## 2011-01-03 DIAGNOSIS — Z8673 Personal history of transient ischemic attack (TIA), and cerebral infarction without residual deficits: Secondary | ICD-10-CM

## 2011-01-03 DIAGNOSIS — I259 Chronic ischemic heart disease, unspecified: Secondary | ICD-10-CM

## 2011-01-03 DIAGNOSIS — I119 Hypertensive heart disease without heart failure: Secondary | ICD-10-CM

## 2011-01-03 DIAGNOSIS — E119 Type 2 diabetes mellitus without complications: Secondary | ICD-10-CM

## 2011-01-03 DIAGNOSIS — I421 Obstructive hypertrophic cardiomyopathy: Secondary | ICD-10-CM

## 2011-01-03 DIAGNOSIS — E7800 Pure hypercholesterolemia, unspecified: Secondary | ICD-10-CM

## 2011-01-03 LAB — BASIC METABOLIC PANEL
Chloride: 102 mEq/L (ref 96–112)
GFR: 41.63 mL/min — ABNORMAL LOW (ref >60.00)
Potassium: 5.7 mEq/L — ABNORMAL HIGH (ref 3.5–5.1)
Sodium: 143 mEq/L (ref 135–145)

## 2011-01-03 LAB — HEPATIC FUNCTION PANEL
ALT: 16 U/L (ref 0–53)
AST: 18 U/L (ref 0–37)
Bilirubin, Direct: 0.1 mg/dL (ref 0.0–0.3)
Total Bilirubin: 0.6 mg/dL (ref 0.3–1.2)

## 2011-01-03 LAB — HEMOGLOBIN A1C: Hgb A1c MFr Bld: 7.5 % — ABNORMAL HIGH (ref 4.6–6.5)

## 2011-01-03 LAB — LIPID PANEL: Cholesterol: 155 mg/dL (ref 0–200)

## 2011-01-04 ENCOUNTER — Telehealth: Payer: Self-pay | Admitting: *Deleted

## 2011-01-04 DIAGNOSIS — I119 Hypertensive heart disease without heart failure: Secondary | ICD-10-CM | POA: Insufficient documentation

## 2011-01-04 DIAGNOSIS — E7800 Pure hypercholesterolemia, unspecified: Secondary | ICD-10-CM | POA: Insufficient documentation

## 2011-01-04 DIAGNOSIS — I259 Chronic ischemic heart disease, unspecified: Secondary | ICD-10-CM | POA: Insufficient documentation

## 2011-01-04 DIAGNOSIS — IMO0001 Reserved for inherently not codable concepts without codable children: Secondary | ICD-10-CM | POA: Insufficient documentation

## 2011-01-04 DIAGNOSIS — Z8673 Personal history of transient ischemic attack (TIA), and cerebral infarction without residual deficits: Secondary | ICD-10-CM | POA: Insufficient documentation

## 2011-01-04 DIAGNOSIS — I421 Obstructive hypertrophic cardiomyopathy: Secondary | ICD-10-CM | POA: Insufficient documentation

## 2011-01-04 NOTE — Telephone Encounter (Signed)
Advised son of labs.  He verified with patient that he was not taking any additional potassium.  Advisee to have him cut back on high potassium food intake and increase water intake.

## 2011-01-04 NOTE — Assessment & Plan Note (Signed)
Patient has a past history of high blood pressure.  His pressure is presently controlled on current therapy.  He is not having any significant fluid retention and he has to take a fluid pill only every 3 or 4 days now.

## 2011-01-04 NOTE — Assessment & Plan Note (Signed)
The patient has a history of known ischemic heart disease.  He underwent stenting of his right coronary artery in 2001.  He has not been experiencing any recurrent chest pain or angina.

## 2011-01-04 NOTE — Assessment & Plan Note (Signed)
The patient has a history of an old cerebrovascular accident which was secondary to poorly controlled hypertension.  He's had no TIA or stroke symptoms recently.  He still has poor balance but his overall strength has improved.

## 2011-01-04 NOTE — Progress Notes (Signed)
[name] Date of Birth:  March 04, 1931 [facility] / [facility] [address]   [address] [place], [place]  [address] [phone]           Fax   [phone]  History of Present Illness: This pleasant 77 year old gentleman is seen for a four-month followup office visit.  He has a complex past medical history.  He has known ischemic heart disease and is status post stenting of his right coronary artery in 2001.  His stent was on 12/23/99.  Of interest was the fact that on 08/08/99 he had had a normal adenosine Cardiolite stress test with no evidence of ischemia.  The patient has not been experiencing any recent chest pain or angina.  He has not been expressing any symptoms of congestive heart failure although he does have mild peripheral edema.  His last echocardiogram was in January 2010 when he was admitted with a stroke and at that time his ejection fraction was 60% and he had moderate LVH and moderate asymmetric septal hypertrophy and mild to moderate aortic valve stenosis and a dilated Reed atrium.  He was in normal sinus rhythm.  He has not been aware of any palpitations or arrhythmia.  He's had no subsequent TIA or stroke symptoms.  The patient is diabetic and is on  insulin.  He is not having any hypoglycemic episodes.  Patient has a history of hypercholesterolemia and is on statin therapy.  Current Outpatient Prescriptions  Medication Sig Dispense Refill  . amLODipine (NORVASC) 5 MG tablet Take 1 tablet (5 mg total) by mouth daily.  30 tablet  11  . clopidogrel (PLAVIX) 75 MG tablet Take 75 mg by mouth daily.        . fexofenadine (ALLEGRA) 60 MG tablet Take 60 mg by mouth daily.        . hydrochlorothiazide (,MICROZIDE/HYDRODIURIL,) 12.5 MG capsule Take 1 capsule (12.5 mg total) by mouth daily.  90 capsule  3  . insulin NPH-insulin regular (NOVOLIN 70/30) (70-30) 100 UNIT/ML injection Inject 30 Units into the skin daily.        . isosorbide mononitrate (IMDUR) 60 MG  24 hr tablet Take 1 tablet (60 mg total) by mouth every morning.  30 tablet  11  . LANTUS 100 UNIT/ML injection INJECT 10 UNITS SUBCUTANEOUSLY AT BEDTIME  10 mL  3  . lovastatin (MEVACOR) 10 MG tablet Take 10 mg by mouth at bedtime.        . metoprolol (LOPRESSOR) 100 MG tablet Take 200 mg by mouth 2 (two) times daily.        . nitroGLYCERIN (NITROSTAT) 0.4 MG SL tablet Place 0.4 mg under the tongue every 5 (five) minutes as needed.        . ramipril (ALTACE) 10 MG capsule Take 1 capsule (10 mg total) by mouth daily.  90 capsule  3    Allergies  Allergen Reactions  . Zyban (Bupropion Hcl)     Patient Active Problem List  Diagnoses  . Ischemic heart disease  . Hypertrophic obstructive cardiomyopathy  . Cerebrovascular accident, old  . Diabetes mellitus  . Hypercholesterolemia  . Benign hypertensive heart disease without heart failure    History  Smoking status  . Former Smoker -- 1.0 packs/day for 45 years  . Types: Cigarettes  . Quit date: 06/17/1993  Smokeless tobacco  . Not on file    History  Alcohol Use No    Family History  Problem Relation Age of Onset  .  Arthritis Mother   . Heart disease Mother   . Hypertension Mother   . Diabetes Mother   . Heart disease Brother   . Diabetes Brother     Review of Systems: Constitutional: no fever chills diaphoresis or fatigue or change in weight.  Head and neck: no hearing loss, no epistaxis, no photophobia or visual disturbance. Respiratory: No cough, shortness of breath or wheezing. Cardiovascular: No chest pain peripheral edema, palpitations. Gastrointestinal: No abdominal distention, no abdominal pain, no change in bowel habits hematochezia or melena. Genitourinary: No dysuria, no frequency, no urgency, no nocturia. Musculoskeletal:No arthralgias, no back pain, no gait disturbance or myalgias. Neurological: No dizziness, no headaches, no numbness, no seizures, no syncope, no weakness, no tremors. Hematologic: No  lymphadenopathy, no easy bruising. Psychiatric: No confusion, no hallucinations, no sleep disturbance.    Physical Exam: Filed Vitals:   01/03/11 0909  BP: 120/80  Pulse: 72  The general appearance reveals a well-developed well-nourished gentleman in no distress.Pupils equal and reactive.   Extraocular Movements are full.  There is no scleral icterus.  The mouth and pharynx are normal.  The neck is supple.  The carotids reveal no bruits.  The jugular venous pressure is normal.  The thyroid is not enlarged.  There is no lymphadenopathy.  The chest is clear to percussion and auscultation. There are no rales or rhonchi. Expansion of the chest is symmetrical.  The precordium is quiet.  The first heart sound is normal.  The second heart sound is physiologically split.  There is no  gallop rub or click.  There is no abnormal lift or heave.There is a grade 2/6 systolic ejection murmur at apex and base.  No diastolic murmurThe abdomen is soft and nontender. Bowel sounds are normal. The liver and spleen are not enlarged. There Are no abdominal masses. There are no bruits.  The pedal pulses are good.  There is no phlebitis And there is trace edema.  There is no cyanosis or clubbing. The skin is warm and dry.  There is no rash.        Assessment / Plan: Continue same medication.  Continue to take fluid pill as necessary for recurrent edema.  Recheck in 4 months for followup office visit and lab work

## 2011-01-04 NOTE — Telephone Encounter (Signed)
Message copied by Burnell Blanks on Fri Jan 04, 2011 10:17 AM ------      Message from: Cassell Clement      Created: Fri Jan 04, 2011  9:41 AM       Please report.  The potassium level is too high.  I don't see that he is on any potassium tablets.  He needs to cut back on dietary potassium foods.His kidneys are too dry and he needs to drink more water practically not whether to help his BUN and his potassium.The liver tests are fine.  The hemoglobin A1c has improved to 7.5.  The lipid panel is satisfactory.  Continue same medication and cut back on high potassium foods and drink more fluids typically water

## 2011-01-04 NOTE — Assessment & Plan Note (Signed)
The patient is diabetic.  He's not having any hypoglycemic reactions. 

## 2011-01-04 NOTE — Progress Notes (Signed)
Advised son of results.  

## 2011-01-04 NOTE — Assessment & Plan Note (Signed)
The patient has a history of hypertrophic obstructive cardiomyopathy.  He is on long-term beta blocker in the form ofLopressor.  The patient is not having any dizzy spells or syncope.  He's not having any significant exertional dyspnea or chest pain.

## 2011-02-06 ENCOUNTER — Other Ambulatory Visit: Payer: Self-pay | Admitting: *Deleted

## 2011-02-06 DIAGNOSIS — E119 Type 2 diabetes mellitus without complications: Secondary | ICD-10-CM

## 2011-02-06 MED ORDER — UNABLE TO FIND: Status: DC

## 2011-02-06 NOTE — Telephone Encounter (Signed)
Faxed back.

## 2011-04-10 ENCOUNTER — Other Ambulatory Visit: Payer: Self-pay | Admitting: *Deleted

## 2011-04-10 MED ORDER — LOVASTATIN 10 MG PO TABS: 10.0000 mg | ORAL_TABLET | Freq: Every day | ORAL | Status: DC

## 2011-04-10 NOTE — Telephone Encounter (Signed)
Fax Received. Refill Completed. Berel Najjar Chowoe (M.A)  

## 2011-05-07 ENCOUNTER — Other Ambulatory Visit: Payer: Medicare Other | Admitting: *Deleted

## 2011-05-07 ENCOUNTER — Ambulatory Visit: Payer: Medicare Other | Admitting: Cardiology

## 2011-05-07 ENCOUNTER — Other Ambulatory Visit: Payer: Self-pay | Admitting: *Deleted

## 2011-05-07 MED ORDER — GLUCOSE BLOOD VI STRP: ORAL_STRIP | Status: DC

## 2011-05-10 ENCOUNTER — Other Ambulatory Visit (INDEPENDENT_AMBULATORY_CARE_PROVIDER_SITE_OTHER): Payer: Medicare Other | Admitting: *Deleted

## 2011-05-10 ENCOUNTER — Encounter: Payer: Self-pay | Admitting: Cardiology

## 2011-05-10 ENCOUNTER — Ambulatory Visit (INDEPENDENT_AMBULATORY_CARE_PROVIDER_SITE_OTHER): Payer: Medicare Other | Admitting: Cardiology

## 2011-05-10 DIAGNOSIS — E785 Hyperlipidemia, unspecified: Secondary | ICD-10-CM

## 2011-05-10 DIAGNOSIS — I69359 Hemiplegia and hemiparesis following cerebral infarction affecting unspecified side: Secondary | ICD-10-CM

## 2011-05-10 DIAGNOSIS — I119 Hypertensive heart disease without heart failure: Secondary | ICD-10-CM

## 2011-05-10 DIAGNOSIS — Z8673 Personal history of transient ischemic attack (TIA), and cerebral infarction without residual deficits: Secondary | ICD-10-CM

## 2011-05-10 DIAGNOSIS — E119 Type 2 diabetes mellitus without complications: Secondary | ICD-10-CM

## 2011-05-10 DIAGNOSIS — I259 Chronic ischemic heart disease, unspecified: Secondary | ICD-10-CM

## 2011-05-10 DIAGNOSIS — I69959 Hemiplegia and hemiparesis following unspecified cerebrovascular disease affecting unspecified side: Secondary | ICD-10-CM

## 2011-05-10 DIAGNOSIS — I421 Obstructive hypertrophic cardiomyopathy: Secondary | ICD-10-CM

## 2011-05-10 LAB — BASIC METABOLIC PANEL
BUN: 23 mg/dL (ref 6–23)
CO2: 28 mEq/L (ref 19–32)
Calcium: 9.2 mg/dL (ref 8.4–10.5)
Chloride: 104 mEq/L (ref 96–112)
Creatinine, Ser: 1.6 mg/dL — ABNORMAL HIGH (ref 0.4–1.5)
GFR: 43.66 mL/min — ABNORMAL LOW (ref >60.00)
Glucose, Bld: 124 mg/dL — ABNORMAL HIGH (ref 70–99)
Potassium: 4.2 mEq/L (ref 3.5–5.1)
Sodium: 140 mEq/L (ref 135–145)

## 2011-05-10 LAB — HEPATIC FUNCTION PANEL
Albumin: 4 g/dL (ref 3.5–5.2)
Alkaline Phosphatase: 60 U/L (ref 39–117)
Total Protein: 7.7 g/dL (ref 6.0–8.3)

## 2011-05-10 LAB — LIPID PANEL
Cholesterol: 162 mg/dL (ref 0–200)
HDL: 48 mg/dL (ref >39.00)
LDL Cholesterol: 86 mg/dL (ref 0–99)
Triglycerides: 141 mg/dL (ref 0.0–149.0)
VLDL: 28.2 mg/dL (ref 0.0–40.0)

## 2011-05-10 NOTE — Assessment & Plan Note (Signed)
The patient has not been experiencing any recurrent chest pain or angina. 

## 2011-05-10 NOTE — Assessment & Plan Note (Signed)
Patient has not been having any atrial fibrillation.  He has had no dizziness or syncope.

## 2011-05-10 NOTE — Assessment & Plan Note (Signed)
Patient denies any hypoglycemic episodes episodes

## 2011-05-10 NOTE — Assessment & Plan Note (Signed)
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  He is no longer able to do much work in the garden except raise a few tomatoes.  Walks with a cane

## 2011-05-10 NOTE — Progress Notes (Signed)
[name] Date of Birth:  1930/08/27 [facility] / [facility] [address]   [address] [place], [place]  [address] [phone]           Fax   [phone]  History of Present Illness: This pleasant 77 year old gentleman is seen for a scheduled four-month followup office visit he has a complex past medical history.  He has known ischemic heart disease.  He had a stent to his right coronary artery in 2001 as a history of IHSS and moderate LVH.  His last echocardiogram was in January 2010.  He has a past history of cerebrovascular accident and history of high blood pressure.  Patient is diabetic and is on insulin.  As hypercholesterolemia and is on statin therapy.  Current Outpatient Prescriptions  Medication Sig Dispense Refill  . amLODipine (NORVASC) 5 MG tablet Take 1 tablet (5 mg total) by mouth daily.  30 tablet  11  . clopidogrel (PLAVIX) 75 MG tablet Take 75 mg by mouth daily.        Marland Kitchen glucose blood test strip ONE TOUCH ULTRA TEST STRIPS ( Use as instructed )  100 each  6  . hydrochlorothiazide (,MICROZIDE/HYDRODIURIL,) 12.5 MG capsule Take 1 capsule (12.5 mg total) by mouth daily.  90 capsule  3  . insulin NPH-insulin regular (NOVOLIN 70/30) (70-30) 100 UNIT/ML injection Inject 30 Units into the skin daily.        . isosorbide mononitrate (IMDUR) 60 MG 24 hr tablet Take 1 tablet (60 mg total) by mouth every morning.  30 tablet  11  . LANTUS 100 UNIT/ML injection INJECT 10 UNITS SUBCUTANEOUSLY AT BEDTIME  10 mL  3  . nitroGLYCERIN (NITROSTAT) 0.4 MG SL tablet Place 0.4 mg under the tongue every 5 (five) minutes as needed.        . lovastatin (MEVACOR) 10 MG tablet Take 1 tablet (10 mg total) by mouth at bedtime.  90 tablet  3  . metoprolol (LOPRESSOR) 100 MG tablet Take 200 mg by mouth 2 (two) times daily.        . ramipril (ALTACE) 10 MG capsule Take 1 capsule (10 mg total) by mouth daily.  90 capsule  3    Allergies  Allergen Reactions  . Zyban (Bupropion  Hcl)     Patient Active Problem List  Diagnoses  . Ischemic heart disease  . Hypertrophic obstructive cardiomyopathy  . Cerebrovascular accident, old  . Diabetes mellitus  . Hypercholesterolemia  . Benign hypertensive heart disease without heart failure    History  Smoking status  . Former Smoker -- 1.0 packs/day for 45 years  . Types: Cigarettes  . Quit date: 06/17/1993  Smokeless tobacco  . Not on file    History  Alcohol Use No    Family History  Problem Relation Age of Onset  . Arthritis Mother   . Heart disease Mother   . Hypertension Mother   . Diabetes Mother   . Heart disease Brother   . Diabetes Brother     Review of Systems: Constitutional: no fever chills diaphoresis or fatigue or change in weight.  Head and neck: no hearing loss, no epistaxis, no photophobia or visual disturbance. Respiratory: No cough, shortness of breath or wheezing. Cardiovascular: No chest pain peripheral edema, palpitations. Gastrointestinal: No abdominal distention, no abdominal pain, no change in bowel habits hematochezia or melena. Genitourinary: No dysuria, no frequency, no urgency, no nocturia. Musculoskeletal:No arthralgias, no back pain, no gait disturbance or myalgias. Neurological: No  dizziness, no headaches, no numbness, no seizures, no syncope, no weakness, no tremors. Hematologic: No lymphadenopathy, no easy bruising. Psychiatric: No confusion, no hallucinations, no sleep disturbance.    Physical Exam: Filed Vitals:   05/10/11 0909  BP: 142/76  Pulse: 70   general appearance reveals an elderly gentleman in no acute distress.  He walks with a cane.  He has a right hemiparesis which is mild.Pupils equal and reactive.   Extraocular Movements are full.  There is no scleral icterus.  The mouth and pharynx are normal.  The neck is supple.  The carotids reveal no bruits.  The jugular venous pressure is normal.  The thyroid is not enlarged.  There is no  lymphadenopathy.  The chest is clear to percussion and auscultation. There are no rales or rhonchi. Expansion of the chest is symmetrical.  Heart reveals grade 3/6 harsh systolic ejection murmur at the base no diastolic murmur.  No gallop or rub.The abdomen is soft and nontender. Bowel sounds are normal. The liver and spleen are not enlarged. There Are no abdominal masses. There are no bruits.  The pedal pulses are good.  There is no phlebitis or edema.  There is no cyanosis or clubbing. Neurologic reveals right-sided weakness.The skin is warm and dry.  There is no rash.    Assessment / Plan: Continue same medication.  Recheck in 4 months for office visit lipid panel hepatic function panel his metabolic panel and hemoglobin Z6X and EKG

## 2011-05-10 NOTE — Patient Instructions (Signed)
Your physician recommends that you continue on your current medications as directed. Please refer to the Current Medication list given to you today. Your physician recommends that you schedule a follow-up appointment in: 4 months with fasting labs (lp/bmet/hfp/A1c)

## 2011-05-14 ENCOUNTER — Encounter: Payer: Self-pay | Admitting: *Deleted

## 2011-05-20 ENCOUNTER — Other Ambulatory Visit: Payer: Self-pay | Admitting: *Deleted

## 2011-05-20 MED ORDER — INSULIN NPH ISOPHANE & REGULAR (70-30) 100 UNIT/ML ~~LOC~~ SUSP: 28.0000 [IU] | Freq: Every day | SUBCUTANEOUS | Status: DC

## 2011-05-20 NOTE — Telephone Encounter (Signed)
Refilled humulin 70-30

## 2011-06-20 ENCOUNTER — Other Ambulatory Visit: Payer: Self-pay | Admitting: *Deleted

## 2011-06-20 MED ORDER — CLOPIDOGREL BISULFATE 75 MG PO TABS: 75.0000 mg | ORAL_TABLET | Freq: Every day | ORAL | Status: DC

## 2011-06-20 MED ORDER — METOPROLOL TARTRATE 100 MG PO TABS: 200.0000 mg | ORAL_TABLET | Freq: Two times a day (BID) | ORAL | Status: DC

## 2011-06-20 NOTE — Telephone Encounter (Signed)
Refilled lopressor

## 2011-06-20 NOTE — Telephone Encounter (Signed)
Refilled plavix 

## 2011-06-25 ENCOUNTER — Telehealth: Payer: Self-pay | Admitting: Cardiology

## 2011-06-25 NOTE — Telephone Encounter (Signed)
New problem:  Please clarify direction / dosage of medication - metoprolol

## 2011-06-25 NOTE — Telephone Encounter (Signed)
[name] is calling to find out what dose of Metoprolol he should be taking?  She states at the last visit in Nov, he was told by Dr Patty Sermons to cut his 100mg  tablets in half and take 50mg  bid.  The new rx he received from the pharmacy states 100mg  bid.  His bp is running 116-118/64-80 on the 50mg  bid.

## 2011-06-25 NOTE — Telephone Encounter (Signed)
Stay on 50 BID

## 2011-06-26 NOTE — Telephone Encounter (Signed)
I agree

## 2011-06-26 NOTE — Telephone Encounter (Signed)
Spoke with wife and she stated patient has only been getting 1/2 100 mg (50 mg ) once daily and doing well on this dose.  Has been doing this for over a year.  Advised to continue what he has been doing.

## 2011-09-10 ENCOUNTER — Other Ambulatory Visit: Payer: Medicare Other

## 2011-09-10 ENCOUNTER — Encounter: Payer: Self-pay | Admitting: Cardiology

## 2011-09-10 ENCOUNTER — Ambulatory Visit (INDEPENDENT_AMBULATORY_CARE_PROVIDER_SITE_OTHER): Payer: Medicare Other | Admitting: Cardiology

## 2011-09-10 VITALS — BP 138/80 | HR 72 | Wt 182.0 lb

## 2011-09-10 DIAGNOSIS — I119 Hypertensive heart disease without heart failure: Secondary | ICD-10-CM

## 2011-09-10 DIAGNOSIS — I259 Chronic ischemic heart disease, unspecified: Secondary | ICD-10-CM

## 2011-09-10 DIAGNOSIS — E119 Type 2 diabetes mellitus without complications: Secondary | ICD-10-CM

## 2011-09-10 DIAGNOSIS — I2510 Atherosclerotic heart disease of native coronary artery without angina pectoris: Secondary | ICD-10-CM

## 2011-09-10 DIAGNOSIS — E7800 Pure hypercholesterolemia, unspecified: Secondary | ICD-10-CM

## 2011-09-10 DIAGNOSIS — I421 Obstructive hypertrophic cardiomyopathy: Secondary | ICD-10-CM

## 2011-09-10 LAB — HEPATIC FUNCTION PANEL
Albumin: 4.1 g/dL (ref 3.5–5.2)
Alkaline Phosphatase: 66 U/L (ref 39–117)
Total Bilirubin: 0.4 mg/dL (ref 0.3–1.2)

## 2011-09-10 LAB — BASIC METABOLIC PANEL
BUN: 30 mg/dL — ABNORMAL HIGH (ref 6–23)
Calcium: 9.2 mg/dL (ref 8.4–10.5)
GFR: 38.9 mL/min — ABNORMAL LOW (ref >60.00)
Glucose, Bld: 131 mg/dL — ABNORMAL HIGH (ref 70–99)
Sodium: 138 mEq/L (ref 135–145)

## 2011-09-10 LAB — LIPID PANEL
HDL: 45.9 mg/dL (ref >39.00)
LDL Cholesterol: 83 mg/dL (ref 0–99)
VLDL: 25.8 mg/dL (ref 0.0–40.0)

## 2011-09-10 NOTE — Progress Notes (Signed)
Quick Note:  Please report to patient. The recent labs are stable. Continue same medication and careful diet. A1C is better. Continue same meds.Kidneys dry so drink more water. ______

## 2011-09-10 NOTE — Progress Notes (Signed)
[name] Date of Birth:  December 13, 1930 [facility] [address] [place], [place]  [address] [phone]         Fax   [phone]  History of Present Illness: This pleasant 78 year old gentleman is seen for a scheduled four-month followup office visit.  He has a history of ischemic heart disease and a history of hypertrophic cardiomyopathy.  He had a stent to his right coronary artery in 2001.  He has a prior history of a stroke.  He has a history of high blood pressure.  He is an insulin-dependent diabetic and has hypercholesterolemia.  Since last visit she's been doing fair.  His wife who is with him today indicates that his shortness of breath has been getting worse and that he is quite sedentary and does not do much walking.  His weight is unchanged.  He has had some mild peripheral edema.  Current Outpatient Prescriptions  Medication Sig Dispense Refill  . amLODipine (NORVASC) 5 MG tablet Take 1 tablet (5 mg total) by mouth daily.  30 tablet  11  . clopidogrel (PLAVIX) 75 MG tablet Take 1 tablet (75 mg total) by mouth daily.  30 tablet  11  . glucose blood test strip ONE TOUCH ULTRA TEST STRIPS ( Use as instructed )  100 each  6  . hydrochlorothiazide (,MICROZIDE/HYDRODIURIL,) 12.5 MG capsule Take 1 capsule (12.5 mg total) by mouth daily.  90 capsule  3  . insulin NPH-insulin regular (HUMULIN 70/30) (70-30) 100 UNIT/ML injection Inject 28 Units into the skin daily.  10 mL  12  . insulin NPH-insulin regular (NOVOLIN 70/30) (70-30) 100 UNIT/ML injection Inject 30 Units into the skin daily.        . isosorbide mononitrate (IMDUR) 60 MG 24 hr tablet Take 1 tablet (60 mg total) by mouth every morning.  30 tablet  11  . LANTUS 100 UNIT/ML injection INJECT 10 UNITS SUBCUTANEOUSLY AT BEDTIME  10 mL  3  . lovastatin (MEVACOR) 10 MG tablet Take 1 tablet (10 mg total) by mouth at bedtime.  90 tablet  3  . metoprolol (LOPRESSOR) 100 MG tablet Take 100 mg by mouth as  directed. Take 1/2 tablet daily      . nitroGLYCERIN (NITROSTAT) 0.4 MG SL tablet Place 0.4 mg under the tongue every 5 (five) minutes as needed.        . ramipril (ALTACE) 10 MG capsule Take 1 capsule (10 mg total) by mouth daily.  90 capsule  3    Allergies  Allergen Reactions  . Zyban (Bupropion Hcl)     Patient Active Problem List  Diagnoses  . Ischemic heart disease  . Hypertrophic obstructive cardiomyopathy  . Cerebrovascular accident, old  . Diabetes mellitus  . Hypercholesterolemia  . Benign hypertensive heart disease without heart failure    History  Smoking status  . Former Smoker -- 1.0 packs/day for 45 years  . Types: Cigarettes  . Quit date: 06/17/1993  Smokeless tobacco  . Not on file    History  Alcohol Use No    Family History  Problem Relation Age of Onset  . Arthritis Mother   . Heart disease Mother   . Hypertension Mother   . Diabetes Mother   . Heart disease Brother   . Diabetes Brother     Review of Systems: Constitutional: no fever chills diaphoresis or fatigue or change in weight.  Head and neck: no hearing loss, no epistaxis, no photophobia or visual disturbance. Respiratory:  No cough, shortness of breath or wheezing. Cardiovascular: No chest pain peripheral edema, palpitations. Gastrointestinal: No abdominal distention, no abdominal pain, no change in bowel habits hematochezia or melena. Genitourinary: No dysuria, no frequency, no urgency, no nocturia. Musculoskeletal:No arthralgias, no back pain, no gait disturbance or myalgias. Neurological: No dizziness, no headaches, no numbness, no seizures, no syncope, no weakness, no tremors. Hematologic: No lymphadenopathy, no easy bruising. Psychiatric: No confusion, no hallucinations, no sleep disturbance.    Physical Exam: Filed Vitals:   09/10/11 0939  BP: 138/80  Pulse: 72   the general appearance reveals a well-developed well-nourished elderly gentleman in no distress.Pupils equal  and reactive.   Extraocular Movements are full.  There is no scleral icterus.  The mouth and pharynx are normal.  The neck is supple.  The carotids reveal no bruits.  The jugular venous pressure is normal.  The thyroid is not enlarged.  There is no lymphadenopathy.  The chest is clear to percussion and auscultation. There are no rales or rhonchi. Expansion of the chest is symmetrical.  Heart reveals a grade 2/6 systolic ejection murmur at the base.  No diastolic murmur.  No gallop or rub.The abdomen is soft and nontender. Bowel sounds are normal. The liver and spleen are not enlarged. There Are no abdominal masses. There are no bruits.  The pedal pulses are good.  There is no phlebitis.  There is mild peripheral edema worse in the right than the Reed foot  There is no cyanosis or clubbing. Strength is normal and symmetrical in all extremities.  There is no lateralizing weakness.  There are no sensory deficits.  The skin is warm and dry.  There is no rash.  EKG today shows normal sinus rhythm and lateral T-wave abnormality unchanged from prior tracings.  Assessment / Plan: The patient is to continue same medication.  He needs to be more careful with sweets and carbohydrates and to lose weight.  I've encouraged him to get more exercise.  He'll be rechecked in 4 months for followup office visit and fasting lab work including hemoglobin A1c

## 2011-09-10 NOTE — Assessment & Plan Note (Signed)
He has not been aware of any arrhythmia.  He is not having any symptoms of exertional dizziness or syncope.

## 2011-09-10 NOTE — Assessment & Plan Note (Signed)
No recent chest pain or angina pectoris.  He has not had to take any sublingual nitroglycerin to

## 2011-09-10 NOTE — Patient Instructions (Signed)
Will obtain labs today and call you with the results (lp/bmet/hfp/a1c)  Your physician recommends that you continue on your current medications as directed. Please refer to the Current Medication list given to you today.  Your physician wants you to follow-up in: 4 months You will receive a reminder letter in the mail two months in advance. If you don't receive a letter, please call our office to schedule the follow-up appointment.  

## 2011-09-10 NOTE — Assessment & Plan Note (Signed)
The patient is on insulin.  He's had no recent hypoglycemic episodes.  We are checking lab work today.

## 2011-09-12 ENCOUNTER — Telehealth: Payer: Self-pay | Admitting: *Deleted

## 2011-09-12 NOTE — Telephone Encounter (Signed)
Message copied by Burnell Blanks on Thu Sep 12, 2011  5:39 PM ------      Message from: Cassell Clement      Created: Tue Sep 10, 2011  8:55 PM       Please report to patient.  The recent labs are stable. Continue same medication and careful diet.  A1C is better. Continue same meds.Kidneys dry so drink more water.

## 2011-09-12 NOTE — Telephone Encounter (Signed)
Advised wife of labs 

## 2011-09-17 ENCOUNTER — Other Ambulatory Visit: Payer: Self-pay | Admitting: *Deleted

## 2011-09-17 DIAGNOSIS — I10 Essential (primary) hypertension: Secondary | ICD-10-CM

## 2011-09-17 DIAGNOSIS — I2510 Atherosclerotic heart disease of native coronary artery without angina pectoris: Secondary | ICD-10-CM

## 2011-09-17 MED ORDER — CLOPIDOGREL BISULFATE 75 MG PO TABS: 75.0000 mg | ORAL_TABLET | Freq: Every day | ORAL | Status: DC

## 2011-09-17 MED ORDER — AMLODIPINE BESYLATE 5 MG PO TABS: 5.0000 mg | ORAL_TABLET | Freq: Every day | ORAL | Status: DC

## 2011-09-17 MED ORDER — ISOSORBIDE MONONITRATE ER 60 MG PO TB24: 60.0000 mg | ORAL_TABLET | ORAL | Status: DC

## 2011-10-23 ENCOUNTER — Other Ambulatory Visit: Payer: Self-pay | Admitting: *Deleted

## 2011-10-28 ENCOUNTER — Other Ambulatory Visit: Payer: Self-pay | Admitting: *Deleted

## 2011-10-28 MED ORDER — FEXOFENADINE HCL 60 MG PO TABS: 60.0000 mg | ORAL_TABLET | Freq: Every day | ORAL | Status: DC

## 2011-10-28 NOTE — Telephone Encounter (Signed)
Refilled fexofenadine.

## 2011-10-30 ENCOUNTER — Other Ambulatory Visit: Payer: Self-pay | Admitting: Cardiology

## 2011-10-30 DIAGNOSIS — I119 Hypertensive heart disease without heart failure: Secondary | ICD-10-CM

## 2011-10-30 MED ORDER — HYDROCHLOROTHIAZIDE 12.5 MG PO CAPS: 12.5000 mg | ORAL_CAPSULE | Freq: Every day | ORAL | Status: DC

## 2011-10-30 MED ORDER — RAMIPRIL 10 MG PO CAPS: 10.0000 mg | ORAL_CAPSULE | Freq: Every day | ORAL | Status: DC

## 2012-01-29 ENCOUNTER — Other Ambulatory Visit: Payer: Self-pay | Admitting: Cardiology

## 2012-04-07 ENCOUNTER — Other Ambulatory Visit: Payer: Self-pay | Admitting: Cardiology

## 2012-05-21 ENCOUNTER — Other Ambulatory Visit: Payer: Self-pay

## 2012-05-21 MED ORDER — INSULIN NPH ISOPHANE & REGULAR (70-30) 100 UNIT/ML ~~LOC~~ SUSP: 30.0000 [IU] | Freq: Every day | SUBCUTANEOUS | Status: DC

## 2012-05-29 ENCOUNTER — Other Ambulatory Visit: Payer: Self-pay

## 2012-05-29 MED ORDER — INSULIN NPH ISOPHANE & REGULAR (70-30) 100 UNIT/ML ~~LOC~~ SUSP: 30.0000 [IU] | Freq: Every day | SUBCUTANEOUS | Status: DC

## 2012-06-02 ENCOUNTER — Other Ambulatory Visit (INDEPENDENT_AMBULATORY_CARE_PROVIDER_SITE_OTHER): Payer: Medicare Other

## 2012-06-02 ENCOUNTER — Encounter: Payer: Self-pay | Admitting: Cardiology

## 2012-06-02 ENCOUNTER — Ambulatory Visit (INDEPENDENT_AMBULATORY_CARE_PROVIDER_SITE_OTHER): Payer: Medicare Other | Admitting: Cardiology

## 2012-06-02 VITALS — BP 160/85 | HR 77 | Ht 69.0 in | Wt 179.0 lb

## 2012-06-02 DIAGNOSIS — E119 Type 2 diabetes mellitus without complications: Secondary | ICD-10-CM

## 2012-06-02 DIAGNOSIS — I119 Hypertensive heart disease without heart failure: Secondary | ICD-10-CM

## 2012-06-02 DIAGNOSIS — E785 Hyperlipidemia, unspecified: Secondary | ICD-10-CM

## 2012-06-02 DIAGNOSIS — I259 Chronic ischemic heart disease, unspecified: Secondary | ICD-10-CM

## 2012-06-02 DIAGNOSIS — Z8673 Personal history of transient ischemic attack (TIA), and cerebral infarction without residual deficits: Secondary | ICD-10-CM

## 2012-06-02 DIAGNOSIS — E7800 Pure hypercholesterolemia, unspecified: Secondary | ICD-10-CM

## 2012-06-02 LAB — LIPID PANEL
Cholesterol: 149 mg/dL (ref 0–200)
LDL Cholesterol: 80 mg/dL (ref 0–99)
Total CHOL/HDL Ratio: 4

## 2012-06-02 LAB — BASIC METABOLIC PANEL
Chloride: 102 mEq/L (ref 96–112)
Creatinine, Ser: 1.4 mg/dL (ref 0.4–1.5)
Potassium: 3.6 mEq/L (ref 3.5–5.1)
Sodium: 137 mEq/L (ref 135–145)

## 2012-06-02 LAB — HEPATIC FUNCTION PANEL
Alkaline Phosphatase: 61 U/L (ref 39–117)
Bilirubin, Direct: 0.1 mg/dL (ref 0.0–0.3)
Total Bilirubin: 0.7 mg/dL (ref 0.3–1.2)

## 2012-06-02 LAB — HEMOGLOBIN A1C: Hgb A1c MFr Bld: 7.4 % — ABNORMAL HIGH (ref 4.6–6.5)

## 2012-06-02 NOTE — Patient Instructions (Addendum)
Your physician recommends that you continue on your current medications as directed. Please refer to the Current Medication list given to you today.  Your physician wants you to follow-up in: 4 months with fasting labs (lp/bmet/hfp/a1c)  You will receive a reminder letter in the mail two months in advance. If you don't receive a letter, please call our office to schedule the follow-up appointment.  

## 2012-06-02 NOTE — Progress Notes (Signed)
Melanee Left Date of Birth:  Jun 14, 1931 [facility] [address] [place], [place]  [address] [phone]         Fax   [phone]   History of Present Illness:  This pleasant 78 year old gentleman is seen for a scheduled four-month followup office visit. He has a history of ischemic heart disease and a history of hypertrophic cardiomyopathy. He had a stent to his right coronary artery in 2001. He has a prior history of a stroke. He has a history of high blood pressure. He is an insulin-dependent diabetic and has hypercholesterolemia. Since last visit he's been doing fair.   Current Outpatient Prescriptions  Medication Sig Dispense Refill  . amLODipine (NORVASC) 5 MG tablet Take 1 tablet (5 mg total) by mouth daily.  90 tablet  3  . clopidogrel (PLAVIX) 75 MG tablet Take 1 tablet (75 mg total) by mouth daily.  90 tablet  3  . fexofenadine (ALLEGRA) 60 MG tablet Take 1 tablet (60 mg total) by mouth daily.  90 tablet  3  . hydrochlorothiazide (MICROZIDE) 12.5 MG capsule Take 1 capsule (12.5 mg total) by mouth daily.  90 capsule  3  . insulin NPH-insulin regular (NOVOLIN 70/30) (70-30) 100 UNIT/ML injection Inject 30 Units into the skin daily.  10 mL  prn  . isosorbide mononitrate (IMDUR) 60 MG 24 hr tablet Take 1 tablet (60 mg total) by mouth every morning.  90 tablet  3  . LANTUS 100 UNIT/ML injection INJECT 10 UNITS SUBCUTANEOUSLY AT BEDTIME  10 mL  3  . lovastatin (MEVACOR) 10 MG tablet TAKE 1 TABLET (10 MG TOTAL) BY MOUTH AT BEDTIME.  90 tablet  3  . metoprolol (LOPRESSOR) 100 MG tablet Take 100 mg by mouth as directed. Take 1/2 tablet daily      . nitroGLYCERIN (NITROSTAT) 0.4 MG SL tablet Place 0.4 mg under the tongue every 5 (five) minutes as needed.        . ramipril (ALTACE) 10 MG capsule Take 1 capsule (10 mg total) by mouth daily.  90 capsule  3  . glucose blood test strip ONE TOUCH ULTRA TEST STRIPS ( Use as instructed )  100 each  6  . insulin  NPH-insulin regular (HUMULIN 70/30) (70-30) 100 UNIT/ML injection Inject 28 Units into the skin daily.  10 mL  12    Allergies  Allergen Reactions  . Zyban (Bupropion Hcl)     Patient Active Problem List  Diagnosis  . Ischemic heart disease  . Hypertrophic obstructive cardiomyopathy  . Cerebrovascular accident, old  . Diabetes mellitus  . Hypercholesterolemia  . Benign hypertensive heart disease without heart failure    History  Smoking status  . Former Smoker -- 1.0 packs/day for 45 years  . Types: Cigarettes  . Quit date: 06/17/1993  Smokeless tobacco  . Not on file    History  Alcohol Use No    Family History  Problem Relation Age of Onset  . Arthritis Mother   . Heart disease Mother   . Hypertension Mother   . Diabetes Mother   . Heart disease Brother   . Diabetes Brother     Review of Systems: Constitutional: no fever chills diaphoresis or fatigue or change in weight.  Head and neck: no hearing loss, no epistaxis, no photophobia or visual disturbance. Respiratory: No cough, shortness of breath or wheezing. Cardiovascular: No chest pain peripheral edema, palpitations. Gastrointestinal: No abdominal distention, no abdominal pain, no change in bowel habits  hematochezia or melena. Genitourinary: No dysuria, no frequency, no urgency, no nocturia. Musculoskeletal:No arthralgias, no back pain, no gait disturbance or myalgias. Neurological: No dizziness, no headaches, no numbness, no seizures, no syncope, no weakness, no tremors. Hematologic: No lymphadenopathy, no easy bruising. Psychiatric: No confusion, no hallucinations, no sleep disturbance.    Physical Exam: Filed Vitals:   06/02/12 0857  BP: 160/85  Pulse: 77   the general appearance reveals a well-developed elderly gentleman in no distress.The head and neck exam reveals pupils equal and reactive.  Extraocular movements are full.  There is no scleral icterus.  The mouth and pharynx are normal.  The neck  is supple.  The carotids reveal no bruits.  The jugular venous pressure is normal.  The  thyroid is not enlarged.  There is no lymphadenopathy.  The chest is clear to percussion and auscultation.  There are no rales or rhonchi.  Expansion of the chest is symmetrical.  The precordium is quiet.  The first heart sound is normal.  The second heart sound is physiologically split.  There is a grade 3/6 systolic ejection murmur at the base consistent with his diagnosis of hypertrophic obstructive cardiomyopathy  There is no abnormal lift or heave.  The abdomen is soft and nontender.  The bowel sounds are normal.  The liver and spleen are not enlarged.  There are no abdominal masses.  There are no abdominal bruits.  Extremities reveal good pedal pulses.  There is no phlebitis or edema.  There is no cyanosis or clubbing.  Strength is normal and symmetrical in all extremities.  There is no lateralizing weakness.  Speech is slightly slurred from his previous stroke.  There are no sensory deficits.  The skin is warm and dry.  There is no rash.    Assessment / Plan: Continue same medication.  Recheck in 4 months for followup office visit EKG lipid panel hepatic function panel basal metabolic panel and hemoglobin Z6X

## 2012-06-02 NOTE — Assessment & Plan Note (Signed)
Sugars have been stable at home and is having no hypoglycemic episodes.  His son who lives with them make sure that he gets his medication on time.

## 2012-06-02 NOTE — Progress Notes (Signed)
Quick Note:  Please report to patient. The recent labs are stable. Continue same medication and careful diet. BS higher and A1C higher so increase lantus to 12 u at HS ______

## 2012-06-02 NOTE — Assessment & Plan Note (Signed)
The patient has had no recurrence of TIA or stroke

## 2012-06-02 NOTE — Assessment & Plan Note (Signed)
Blood pressure has been remaining stable on current therapy.  He checks it intermittently at home.  He has not been expressing any dizziness or syncope.  No chest pain.

## 2012-06-03 ENCOUNTER — Telehealth: Payer: Self-pay | Admitting: *Deleted

## 2012-06-03 NOTE — Telephone Encounter (Signed)
Message copied by Burnell Blanks on Wed Jun 03, 2012  9:04 AM ------      Message from: Cassell Clement      Created: Tue Jun 02, 2012  9:30 PM       Please report to patient.  The recent labs are stable. Continue same medication and careful diet. BS higher and A1C higher so increase lantus to 12 u at HS

## 2012-06-03 NOTE — Telephone Encounter (Signed)
Advised wife of labs and med change

## 2012-08-06 ENCOUNTER — Other Ambulatory Visit: Payer: Self-pay | Admitting: *Deleted

## 2012-08-06 NOTE — Telephone Encounter (Signed)
Opened in Error.

## 2012-08-10 ENCOUNTER — Other Ambulatory Visit: Payer: Self-pay | Admitting: *Deleted

## 2012-08-10 MED ORDER — METOPROLOL TARTRATE 100 MG PO TABS: 100.0000 mg | ORAL_TABLET | ORAL | Status: DC

## 2012-09-16 ENCOUNTER — Other Ambulatory Visit: Payer: Self-pay | Admitting: *Deleted

## 2012-09-16 DIAGNOSIS — I10 Essential (primary) hypertension: Secondary | ICD-10-CM

## 2012-09-16 MED ORDER — CLOPIDOGREL BISULFATE 75 MG PO TABS: 75.0000 mg | ORAL_TABLET | Freq: Every day | ORAL | Status: DC

## 2012-09-16 MED ORDER — AMLODIPINE BESYLATE 5 MG PO TABS: 5.0000 mg | ORAL_TABLET | Freq: Every day | ORAL | Status: DC

## 2012-10-06 ENCOUNTER — Other Ambulatory Visit: Payer: Self-pay | Admitting: *Deleted

## 2012-10-06 DIAGNOSIS — I2510 Atherosclerotic heart disease of native coronary artery without angina pectoris: Secondary | ICD-10-CM

## 2012-10-06 MED ORDER — ISOSORBIDE MONONITRATE ER 60 MG PO TB24: 60.0000 mg | ORAL_TABLET | ORAL | Status: DC

## 2012-10-07 ENCOUNTER — Other Ambulatory Visit: Payer: Self-pay | Admitting: *Deleted

## 2012-10-07 DIAGNOSIS — I2510 Atherosclerotic heart disease of native coronary artery without angina pectoris: Secondary | ICD-10-CM

## 2012-10-07 MED ORDER — ISOSORBIDE MONONITRATE ER 60 MG PO TB24: 60.0000 mg | ORAL_TABLET | ORAL | Status: DC

## 2012-10-16 ENCOUNTER — Encounter: Payer: Self-pay | Admitting: Cardiology

## 2012-10-16 ENCOUNTER — Ambulatory Visit (INDEPENDENT_AMBULATORY_CARE_PROVIDER_SITE_OTHER): Payer: Medicare Other | Admitting: Cardiology

## 2012-10-16 ENCOUNTER — Other Ambulatory Visit (INDEPENDENT_AMBULATORY_CARE_PROVIDER_SITE_OTHER): Payer: Medicare Other

## 2012-10-16 VITALS — BP 132/76 | HR 60 | Ht 69.0 in | Wt 183.2 lb

## 2012-10-16 DIAGNOSIS — I119 Hypertensive heart disease without heart failure: Secondary | ICD-10-CM

## 2012-10-16 DIAGNOSIS — I259 Chronic ischemic heart disease, unspecified: Secondary | ICD-10-CM

## 2012-10-16 DIAGNOSIS — E119 Type 2 diabetes mellitus without complications: Secondary | ICD-10-CM

## 2012-10-16 DIAGNOSIS — Z8673 Personal history of transient ischemic attack (TIA), and cerebral infarction without residual deficits: Secondary | ICD-10-CM

## 2012-10-16 DIAGNOSIS — E785 Hyperlipidemia, unspecified: Secondary | ICD-10-CM

## 2012-10-16 DIAGNOSIS — E7800 Pure hypercholesterolemia, unspecified: Secondary | ICD-10-CM

## 2012-10-16 LAB — HEMOGLOBIN A1C: Hgb A1c MFr Bld: 7.4 % — ABNORMAL HIGH (ref 4.6–6.5)

## 2012-10-16 NOTE — Patient Instructions (Addendum)
Will obtain labs today and call you with the results (lp/bmet/hfp/a1c)  Your physician recommends that you continue on your current medications as directed. Please refer to the Current Medication list given to you today.  Your physician recommends that you schedule a follow-up appointment in: 4 months with fasting labs (lp/bmet/hfp/a1c)

## 2012-10-16 NOTE — Assessment & Plan Note (Signed)
The patient has not been having any hypoglycemic episode.  He has a good appetite and his weight is up 4 pounds since last visit.

## 2012-10-16 NOTE — Assessment & Plan Note (Signed)
The patient has not been having a increased shortness of breath or any dizzy spells or syncope.  He is not aware of any racing of his heart or palpitations.

## 2012-10-16 NOTE — Assessment & Plan Note (Signed)
The patient has had no new TIA or stroke symptoms.

## 2012-10-16 NOTE — Assessment & Plan Note (Signed)
The patient has not been experiencing chest pain or angina.

## 2012-10-16 NOTE — Progress Notes (Signed)
Melanee Left Date of Birth:  17-Jun-1931 [facility] [address] [place], [place]  [address] [phone]         Fax   [phone]  History of Present Illness: This pleasant 79 year old gentleman is seen for a scheduled four-month followup office visit. He has a history of ischemic heart disease and a history of hypertrophic cardiomyopathy. He had a stent to his right coronary artery in 2001. He has a prior history of a stroke. He has a history of high blood pressure. He is an insulin-dependent diabetic and has hypercholesterolemia. Since last visit he's been doing fair.   Current Outpatient Prescriptions  Medication Sig Dispense Refill  . amLODipine (NORVASC) 5 MG tablet Take 1 tablet (5 mg total) by mouth daily.  90 tablet  3  . clopidogrel (PLAVIX) 75 MG tablet Take 1 tablet (75 mg total) by mouth daily.  90 tablet  3  . fexofenadine (ALLEGRA) 60 MG tablet Take 1 tablet (60 mg total) by mouth daily.  90 tablet  3  . hydrochlorothiazide (MICROZIDE) 12.5 MG capsule Take 1 capsule (12.5 mg total) by mouth daily.  90 capsule  3  . insulin glargine (LANTUS) 100 UNIT/ML injection       . insulin NPH-insulin regular (NOVOLIN 70/30) (70-30) 100 UNIT/ML injection Inject 30 Units into the skin daily.  10 mL  prn  . isosorbide mononitrate (IMDUR) 60 MG 24 hr tablet Take 1 tablet (60 mg total) by mouth every morning.  90 tablet  3  . lovastatin (MEVACOR) 10 MG tablet TAKE 1 TABLET (10 MG TOTAL) BY MOUTH AT BEDTIME.  90 tablet  3  . metoprolol (LOPRESSOR) 100 MG tablet Take 1 tablet (100 mg total) by mouth as directed. Take 1/2 tablet daily  45 tablet  3  . nitroGLYCERIN (NITROSTAT) 0.4 MG SL tablet Place 0.4 mg under the tongue every 5 (five) minutes as needed.        . ramipril (ALTACE) 10 MG capsule Take 1 capsule (10 mg total) by mouth daily.  90 capsule  3  . glucose blood test strip ONE TOUCH ULTRA TEST STRIPS ( Use as instructed )  100 each  6  . insulin  NPH-insulin regular (HUMULIN 70/30) (70-30) 100 UNIT/ML injection Inject 28 Units into the skin daily.  10 mL  12   No current facility-administered medications for this visit.    Allergies  Allergen Reactions  . Zyban (Bupropion Hcl)     Patient Active Problem List   Diagnosis Date Noted  . Ischemic heart disease 01/04/2011  . Hypertrophic obstructive cardiomyopathy 01/04/2011  . Cerebrovascular accident, old 01/04/2011  . Diabetes mellitus 01/04/2011  . Hypercholesterolemia 01/04/2011  . Benign hypertensive heart disease without heart failure 01/04/2011    History  Smoking status  . Former Smoker -- 1.00 packs/day for 45 years  . Types: Cigarettes  . Quit date: 06/17/1993  Smokeless tobacco  . Not on file    History  Alcohol Use No    Family History  Problem Relation Age of Onset  . Arthritis Mother   . Heart disease Mother   . Hypertension Mother   . Diabetes Mother   . Heart disease Brother   . Diabetes Brother     Review of Systems: Constitutional: no fever chills diaphoresis or fatigue or change in weight.  Head and neck: no hearing loss, no epistaxis, no photophobia or visual disturbance. Respiratory: No cough, shortness of breath or wheezing. Cardiovascular: No chest  pain peripheral edema, palpitations. Gastrointestinal: No abdominal distention, no abdominal pain, no change in bowel habits hematochezia or melena. Genitourinary: No dysuria, no frequency, no urgency, no nocturia. Musculoskeletal:No arthralgias, no back pain, no gait disturbance or myalgias. Neurological: No dizziness, no headaches, no numbness, no seizures, no syncope, no weakness, no tremors. Hematologic: No lymphadenopathy, no easy bruising. Psychiatric: No confusion, no hallucinations, no sleep disturbance.    Physical Exam: Filed Vitals:   10/16/12 0924  BP: 132/76  Pulse: 60   the general appearance reveals a well-developed well-nourished gentleman in no distress.The head and  neck exam reveals pupils equal and reactive.  Extraocular movements are full.  There is no scleral icterus.  The mouth and pharynx are normal.  The neck is supple.  The carotids reveal no bruits.  The jugular venous pressure is normal.  The  thyroid is not enlarged.  There is no lymphadenopathy.  The chest is clear to percussion and auscultation.  There are no rales or rhonchi.  Expansion of the chest is symmetrical.  The precordium is quiet.  The first heart sound is normal.  The second heart sound is physiologically split.  There is a harsh grade 3/6 systolic ejection murmur loudest at the base.  There is no diastolic murmur. There is no abnormal lift or heave.  The abdomen is soft and nontender.  The bowel sounds are normal.  The liver and spleen are not enlarged.  There are no abdominal masses.  There are no abdominal bruits.  Extremities reveal good pedal pulses.  There is no phlebitis or edema.  There is no cyanosis or clubbing.  Strength is normal and symmetrical in all extremities.  There is no lateralizing weakness.  There are no sensory deficits.  The skin is warm and dry.  There is no rash.  EKG today shows normal sinus rhythm and ST and T-wave abnormalities unchanged since 09/10/11   Assessment / Plan: Continue same medication.  Continue improvement heart healthy diabetic diet.  Try to lose weight.  Recheck in 4 months for followup office visit lipid panel hepatic function panel basal metabolic panel and A1c

## 2012-10-19 LAB — BASIC METABOLIC PANEL
BUN: 24 mg/dL — ABNORMAL HIGH (ref 6–23)
Calcium: 8.9 mg/dL (ref 8.4–10.5)
Chloride: 101 mEq/L (ref 96–112)
Creatinine, Ser: 1.7 mg/dL — ABNORMAL HIGH (ref 0.4–1.5)

## 2012-10-19 LAB — HEPATIC FUNCTION PANEL
AST: 36 U/L (ref 0–37)
Albumin: 4.1 g/dL (ref 3.5–5.2)
Alkaline Phosphatase: 57 U/L (ref 39–117)
Bilirubin, Direct: 0.1 mg/dL (ref 0.0–0.3)
Total Protein: 7.9 g/dL (ref 6.0–8.3)

## 2012-10-19 LAB — LIPID PANEL
Cholesterol: 165 mg/dL (ref 0–200)
Triglycerides: 159 mg/dL — ABNORMAL HIGH (ref 0.0–149.0)

## 2012-10-20 ENCOUNTER — Telehealth: Payer: Self-pay | Admitting: *Deleted

## 2012-10-20 NOTE — Telephone Encounter (Signed)
Message copied by Burnell Blanks on Tue Oct 20, 2012 10:09 AM ------      Message from: Cassell Clement      Created: Tue Oct 20, 2012  6:00 AM       Please report to patient.  The recent labs are stable. Continue same medication and careful diet. A1C still high---watch sweets. ------

## 2012-10-20 NOTE — Progress Notes (Signed)
Quick Note:  Please report to patient. The recent labs are stable. Continue same medication and careful diet. A1C still high---watch sweets. ______

## 2012-10-20 NOTE — Telephone Encounter (Signed)
Advised wife of labs 

## 2012-10-27 ENCOUNTER — Other Ambulatory Visit: Payer: Self-pay | Admitting: *Deleted

## 2012-10-27 DIAGNOSIS — I119 Hypertensive heart disease without heart failure: Secondary | ICD-10-CM

## 2012-10-27 MED ORDER — FEXOFENADINE HCL 60 MG PO TABS: 60.0000 mg | ORAL_TABLET | Freq: Every day | ORAL | Status: DC

## 2012-10-27 MED ORDER — RAMIPRIL 10 MG PO CAPS: 10.0000 mg | ORAL_CAPSULE | Freq: Every day | ORAL | Status: DC

## 2012-10-27 MED ORDER — HYDROCHLOROTHIAZIDE 12.5 MG PO CAPS: 12.5000 mg | ORAL_CAPSULE | Freq: Every day | ORAL | Status: DC

## 2012-12-11 ENCOUNTER — Other Ambulatory Visit: Payer: Self-pay | Admitting: Cardiology

## 2012-12-15 ENCOUNTER — Other Ambulatory Visit: Payer: Self-pay

## 2012-12-15 ENCOUNTER — Telehealth: Payer: Self-pay

## 2012-12-15 NOTE — Telephone Encounter (Signed)
Does the pharmacy carry a different brand or generic to take the place of the humulin 70/30?

## 2012-12-15 NOTE — Telephone Encounter (Signed)
Called a refill on on Humulog to [facility] per [facility]

## 2012-12-29 ENCOUNTER — Encounter: Payer: Self-pay | Admitting: Cardiology

## 2012-12-29 ENCOUNTER — Other Ambulatory Visit: Payer: Self-pay | Admitting: Cardiology

## 2013-02-17 ENCOUNTER — Encounter: Payer: Self-pay | Admitting: Cardiology

## 2013-02-17 ENCOUNTER — Other Ambulatory Visit (INDEPENDENT_AMBULATORY_CARE_PROVIDER_SITE_OTHER): Payer: Medicare Other

## 2013-02-17 ENCOUNTER — Ambulatory Visit (INDEPENDENT_AMBULATORY_CARE_PROVIDER_SITE_OTHER): Payer: Medicare Other | Admitting: Cardiology

## 2013-02-17 VITALS — BP 124/68 | HR 75 | Ht 69.0 in | Wt 179.0 lb

## 2013-02-17 DIAGNOSIS — I259 Chronic ischemic heart disease, unspecified: Secondary | ICD-10-CM

## 2013-02-17 DIAGNOSIS — I421 Obstructive hypertrophic cardiomyopathy: Secondary | ICD-10-CM

## 2013-02-17 DIAGNOSIS — I119 Hypertensive heart disease without heart failure: Secondary | ICD-10-CM

## 2013-02-17 DIAGNOSIS — E7800 Pure hypercholesterolemia, unspecified: Secondary | ICD-10-CM

## 2013-02-17 LAB — HEPATIC FUNCTION PANEL
ALT: 17 U/L (ref 0–53)
Albumin: 4 g/dL (ref 3.5–5.2)
Total Bilirubin: 0.8 mg/dL (ref 0.3–1.2)

## 2013-02-17 LAB — LIPID PANEL
HDL: 46.6 mg/dL (ref >39.00)
Total CHOL/HDL Ratio: 3
VLDL: 29.8 mg/dL (ref 0.0–40.0)

## 2013-02-17 LAB — BASIC METABOLIC PANEL
GFR: 39.78 mL/min — ABNORMAL LOW (ref >60.00)
Glucose, Bld: 129 mg/dL — ABNORMAL HIGH (ref 70–99)
Potassium: 3.9 mEq/L (ref 3.5–5.1)
Sodium: 137 mEq/L (ref 135–145)

## 2013-02-17 NOTE — Assessment & Plan Note (Signed)
Patient remains on Mevacor for his hypercholesterolemia.  At work today pending

## 2013-02-17 NOTE — Patient Instructions (Signed)
Will obtain labs today and call you with the results (lp/bmet/hfp/a1c)  Your physician recommends that you continue on your current medications as directed. Please refer to the Current Medication list given to you today.  Your physician recommends that you schedule a follow-up appointment in: 4 months with fasting labs (lp/bmet/hfp)

## 2013-02-17 NOTE — Progress Notes (Signed)
Quick Note:  Please report to patient. The recent labs are stable. Continue same medication and careful diet. Kidneys are drier. Drink more water. ______ 

## 2013-02-17 NOTE — Assessment & Plan Note (Signed)
The patient denies any recurrent chest pain or angina pectoris.

## 2013-02-17 NOTE — Assessment & Plan Note (Signed)
Patient is not having any hypoglycemic episodes.

## 2013-02-17 NOTE — Progress Notes (Signed)
Melanee Left Date of Birth:  July 16, 1930 [facility] [address] [place], [place]  [address] [phone]         Fax   [phone]  History of Present Illness: This pleasant 79 year old gentleman is seen for a scheduled four-month followup office visit. He has a history of ischemic heart disease and a history of hypertrophic cardiomyopathy. He had a stent to his right coronary artery in 2001. He has a prior history of a stroke. He has a history of high blood pressure. He is an insulin-dependent diabetic and has hypercholesterolemia. Since last visit he's been doing fair.   Current Outpatient Prescriptions  Medication Sig Dispense Refill  . amLODipine (NORVASC) 5 MG tablet Take 1 tablet (5 mg total) by mouth daily.  90 tablet  3  . clopidogrel (PLAVIX) 75 MG tablet Take 1 tablet (75 mg total) by mouth daily.  90 tablet  3  . fexofenadine (ALLEGRA) 60 MG tablet Take 1 tablet (60 mg total) by mouth daily.  90 tablet  3  . hydrochlorothiazide (MICROZIDE) 12.5 MG capsule Take 1 capsule (12.5 mg total) by mouth daily.  90 capsule  3  . insulin NPH-insulin regular (HUMULIN 70/30) (70-30) 100 UNIT/ML injection Inject 28 Units into the skin daily.  10 mL  12  . isosorbide mononitrate (IMDUR) 60 MG 24 hr tablet Take 1 tablet (60 mg total) by mouth every morning.  90 tablet  3  . LANTUS 100 UNIT/ML injection INJECT 10 UNITS SUBCUTANEOUSLY AT BEDTIME  10 mL  3  . lovastatin (MEVACOR) 10 MG tablet TAKE 1 TABLET (10 MG TOTAL) BY MOUTH AT BEDTIME.  90 tablet  3  . metoprolol (LOPRESSOR) 100 MG tablet Take 1 tablet (100 mg total) by mouth as directed. Take 1/2 tablet daily  45 tablet  3  . nitroGLYCERIN (NITROSTAT) 0.4 MG SL tablet Place 0.4 mg under the tongue every 5 (five) minutes as needed.        . ramipril (ALTACE) 10 MG capsule Take 1 capsule (10 mg total) by mouth daily.  90 capsule  3   No current facility-administered medications for this visit.    Allergies    Allergen Reactions  . Zyban [Bupropion Hcl]     Patient Active Problem List   Diagnosis Date Noted  . Ischemic heart disease 01/04/2011  . Hypertrophic obstructive cardiomyopathy 01/04/2011  . Cerebrovascular accident, old 01/04/2011  . Type II or unspecified type diabetes mellitus without mention of complication, uncontrolled 01/04/2011  . Hypercholesterolemia 01/04/2011  . Benign hypertensive heart disease without heart failure 01/04/2011    History  Smoking status  . Former Smoker -- 1.00 packs/day for 45 years  . Types: Cigarettes  . Quit date: 06/17/1993  Smokeless tobacco  . Not on file    History  Alcohol Use No    Family History  Problem Relation Age of Onset  . Arthritis Mother   . Heart disease Mother   . Hypertension Mother   . Diabetes Mother   . Heart disease Brother   . Diabetes Brother     Review of Systems: Constitutional: no fever chills diaphoresis or fatigue or change in weight.  Head and neck: no hearing loss, no epistaxis, no photophobia or visual disturbance. Respiratory: No cough, shortness of breath or wheezing. Cardiovascular: No chest pain peripheral edema, palpitations. Gastrointestinal: No abdominal distention, no abdominal pain, no change in bowel habits hematochezia or melena. Genitourinary: No dysuria, no frequency, no urgency, no nocturia. Musculoskeletal:No  arthralgias, no back pain, no gait disturbance or myalgias. Neurological: No dizziness, no headaches, no numbness, no seizures, no syncope, no weakness, no tremors. Hematologic: No lymphadenopathy, no easy bruising. Psychiatric: No confusion, no hallucinations, no sleep disturbance.    Physical Exam: Filed Vitals:   02/17/13 0856  BP: 124/68  Pulse: 75   general appearance reveals an elderly gentleman in no acute distress.  He walks with a cane because of his previous stroke.The head and neck exam reveals pupils equal and reactive.  Extraocular movements are full.  There is  no scleral icterus.  The mouth and pharynx are normal.  The neck is supple.  The carotids reveal no bruits.  The jugular venous pressure is normal.  The  thyroid is not enlarged.  There is no lymphadenopathy.  The chest is clear to percussion and auscultation.  There are no rales or rhonchi.  Expansion of the chest is symmetrical.  The precordium is quiet.  The first heart sound is normal.  The second heart sound is physiologically split.  There is grade 2/6 systolic murmur of IHSS at the base. There is no abnormal lift or heave.  The abdomen is soft and nontender.  The bowel sounds are normal.  The liver and spleen are not enlarged.  There are no abdominal masses.  There are no abdominal bruits.  Extremities reveal good pedal pulses.  There is no phlebitis or edema.  There is no cyanosis or clubbing.  Strength is normal and symmetrical in all extremities.  There is no lateralizing weakness.  There are no sensory deficits.  The skin is warm and dry.  There is no rash.     Assessment / Plan: The patient is to continue same medication.  Lab work today pending.  Recheck in 4 months for office visit lipid panel hepatic function panel and basal metabolic panel

## 2013-02-17 NOTE — Assessment & Plan Note (Signed)
Patient is not having any dizzy spells or syncope.  No symptoms of CHF.  His heart rhythm remains normal sinus rhythm.

## 2013-02-19 ENCOUNTER — Telehealth: Payer: Self-pay | Admitting: *Deleted

## 2013-02-19 NOTE — Telephone Encounter (Signed)
Message copied by Burnell Blanks on Fri Feb 19, 2013  1:51 PM ------      Message from: Cassell Clement      Created: Wed Feb 17, 2013  9:24 PM       Please report to patient.  The recent labs are stable. Continue same medication and careful diet. Kidneys are drier. Drink more water. ------

## 2013-02-19 NOTE — Telephone Encounter (Signed)
Left message with daughter and mailed copy 

## 2013-04-16 ENCOUNTER — Other Ambulatory Visit: Payer: Self-pay | Admitting: Cardiology

## 2013-06-03 ENCOUNTER — Other Ambulatory Visit: Payer: Self-pay | Admitting: Cardiology

## 2013-06-07 ENCOUNTER — Other Ambulatory Visit: Payer: Self-pay | Admitting: Cardiology

## 2013-06-08 ENCOUNTER — Other Ambulatory Visit: Payer: Self-pay | Admitting: Cardiology

## 2013-06-09 ENCOUNTER — Ambulatory Visit (INDEPENDENT_AMBULATORY_CARE_PROVIDER_SITE_OTHER): Payer: Medicare Other | Admitting: Cardiology

## 2013-06-09 ENCOUNTER — Other Ambulatory Visit: Payer: Medicare Other

## 2013-06-09 ENCOUNTER — Encounter: Payer: Self-pay | Admitting: Cardiology

## 2013-06-09 VITALS — BP 124/62 | HR 54 | Ht 69.0 in | Wt 177.0 lb

## 2013-06-09 DIAGNOSIS — I119 Hypertensive heart disease without heart failure: Secondary | ICD-10-CM

## 2013-06-09 DIAGNOSIS — I259 Chronic ischemic heart disease, unspecified: Secondary | ICD-10-CM

## 2013-06-09 DIAGNOSIS — I421 Obstructive hypertrophic cardiomyopathy: Secondary | ICD-10-CM

## 2013-06-09 LAB — BASIC METABOLIC PANEL
CO2: 29 mEq/L (ref 19–32)
Calcium: 9 mg/dL (ref 8.4–10.5)
GFR: 51.34 mL/min — ABNORMAL LOW (ref >60.00)
Sodium: 140 mEq/L (ref 135–145)

## 2013-06-09 LAB — LIPID PANEL
HDL: 38.4 mg/dL — ABNORMAL LOW (ref >39.00)
Total CHOL/HDL Ratio: 4
Triglycerides: 133 mg/dL (ref 0.0–149.0)

## 2013-06-09 LAB — HEPATIC FUNCTION PANEL
AST: 16 U/L (ref 0–37)
Albumin: 3.9 g/dL (ref 3.5–5.2)

## 2013-06-09 NOTE — Assessment & Plan Note (Signed)
Blood pressure is remaining stable.  No headaches or dizziness.

## 2013-06-09 NOTE — Progress Notes (Signed)
[name] Date of Birth:  1930/10/02 [facility] [address] [place], [place]  [address] [phone]         Fax   [phone]  History of Present Illness: This pleasant 79 year old gentleman is seen for a scheduled four-month followup office visit. He has a history of ischemic heart disease and a history of hypertrophic cardiomyopathy. He had a stent to his right coronary artery in 2001. He has a prior history of a stroke. He has a history of high blood pressure. He is an insulin-dependent diabetic and has hypercholesterolemia. Since last visit he's been doing fair.   Current Outpatient Prescriptions  Medication Sig Dispense Refill  . amLODipine (NORVASC) 5 MG tablet Take 1 tablet (5 mg total) by mouth daily.  90 tablet  3  . B-D INS SYRINGE 0.5CC/30GX1/2" 30G X 1/2" 0.5 ML MISC USE AS DIRECTED  100 each  6  . clopidogrel (PLAVIX) 75 MG tablet Take 1 tablet (75 mg total) by mouth daily.  90 tablet  3  . fexofenadine (ALLEGRA) 60 MG tablet Take 1 tablet (60 mg total) by mouth daily.  90 tablet  3  . hydrochlorothiazide (MICROZIDE) 12.5 MG capsule Take 1 capsule (12.5 mg total) by mouth daily.  90 capsule  3  . insulin NPH-insulin regular (HUMULIN 70/30) (70-30) 100 UNIT/ML injection Inject 28 Units into the skin daily.  10 mL  12  . isosorbide mononitrate (IMDUR) 60 MG 24 hr tablet Take 1 tablet (60 mg total) by mouth every morning.  90 tablet  3  . LANTUS 100 UNIT/ML injection INJECT 10 UNITS SUBCUTANEOUSLY AT BEDTIME  10 mL  3  . lovastatin (MEVACOR) 10 MG tablet TAKE 1 TABLET BY MOUTH AT BEDTIME  90 tablet  0  . metoprolol (LOPRESSOR) 100 MG tablet Take 1 tablet (100 mg total) by mouth as directed. Take 1/2 tablet daily  45 tablet  3  . nitroGLYCERIN (NITROSTAT) 0.4 MG SL tablet Place 0.4 mg under the tongue every 5 (five) minutes as needed.        . ramipril (ALTACE) 10 MG capsule Take 1 capsule (10 mg total) by mouth daily.  90 capsule  3   No current  facility-administered medications for this visit.    Allergies  Allergen Reactions  . Zyban [Bupropion Hcl]     Patient Active Problem List   Diagnosis Date Noted  . Ischemic heart disease 01/04/2011  . Hypertrophic obstructive cardiomyopathy 01/04/2011  . Cerebrovascular accident, old 01/04/2011  . Type II or unspecified type diabetes mellitus without mention of complication, uncontrolled 01/04/2011  . Hypercholesterolemia 01/04/2011  . Benign hypertensive heart disease without heart failure 01/04/2011    History  Smoking status  . Former Smoker -- 1.00 packs/day for 45 years  . Types: Cigarettes  . Quit date: 06/17/1993  Smokeless tobacco  . Not on file    History  Alcohol Use No    Family History  Problem Relation Age of Onset  . Arthritis Mother   . Heart disease Mother   . Hypertension Mother   . Diabetes Mother   . Heart disease Brother   . Diabetes Brother     Review of Systems: Constitutional: no fever chills diaphoresis or fatigue or change in weight.  Head and neck: no hearing loss, no epistaxis, no photophobia or visual disturbance. Respiratory: No cough, shortness of breath or wheezing. Cardiovascular: No chest pain peripheral edema, palpitations. Gastrointestinal: No abdominal distention, no abdominal pain, no change  in bowel habits hematochezia or melena. Genitourinary: No dysuria, no frequency, no urgency, no nocturia. Musculoskeletal:No arthralgias, no back pain, no gait disturbance or myalgias. Neurological: No dizziness, no headaches, no numbness, no seizures, no syncope, no weakness, no tremors. Hematologic: No lymphadenopathy, no easy bruising. Psychiatric: No confusion, no hallucinations, no sleep disturbance.    Physical Exam: Filed Vitals:   06/09/13 1004  BP: 124/62  Pulse: 54   general appearance reveals an elderly gentleman in no acute distress.  He walks with a cane because of his previous stroke.The head and neck exam reveals  pupils equal and reactive.  Extraocular movements are full.  There is no scleral icterus.  The mouth and pharynx are normal.  The neck is supple.  The carotids reveal no bruits.  The jugular venous pressure is normal.  The  thyroid is not enlarged.  There is no lymphadenopathy.  The chest is clear to percussion and auscultation.  There are no rales or rhonchi.  Expansion of the chest is symmetrical.  The precordium is quiet.  The first heart sound is normal.  The second heart sound is physiologically split.  There is grade 2/6 systolic murmur of IHSS at the base. There is no abnormal lift or heave.  The abdomen is soft and nontender.  The bowel sounds are normal.  The liver and spleen are not enlarged.  There are no abdominal masses.  There are no abdominal bruits.  Extremities reveal good pedal pulses.  There is no phlebitis or edema.  There is no cyanosis or clubbing.  Strength is normal and symmetrical in all extremities.  There is no lateralizing weakness.  There are no sensory deficits.  The skin is warm and dry.  There is no rash.     Assessment / Plan: The patient is to continue same medication.  Lab work today pending.  Recheck in 4 months for office visit, EKG, lipid panel hepatic function panel and basal metabolic panel and A1c

## 2013-06-09 NOTE — Patient Instructions (Addendum)
Will obtain labs today and call you with the results (lp/bmet/hfp/a1c)  Your physician recommends that you continue on your current medications as directed. Please refer to the Current Medication list given to you today.  Your physician wants you to follow-up in: 4 months with fasting labs (lp/bmet/hfp) and EKG You will receive a reminder letter in the mail two months in advance. If you don't receive a letter, please call our office to schedule the follow-up appointment.

## 2013-06-09 NOTE — Assessment & Plan Note (Signed)
The patient is not having any hypoglycemic episodes 

## 2013-06-09 NOTE — Assessment & Plan Note (Signed)
The patient has not had any recurrent chest pain or angina. 

## 2013-06-09 NOTE — Assessment & Plan Note (Signed)
The patient is not having any dizzy spells or syncope.  No symptoms of CHF.  No angina.  His rhythm remains normal sinus rhythm

## 2013-06-12 NOTE — Progress Notes (Signed)
Quick Note:  Please report to patient. The recent labs are stable. Continue same medication and careful diet. ______ 

## 2013-06-15 ENCOUNTER — Telehealth: Payer: Self-pay | Admitting: *Deleted

## 2013-06-15 NOTE — Telephone Encounter (Signed)
Advised wife of labs 

## 2013-06-15 NOTE — Telephone Encounter (Signed)
Message copied by Burnell Blanks on Tue Jun 15, 2013 10:32 AM ------      Message from: Cassell Clement      Created: Sat Jun 12, 2013  2:09 PM       Please report to patient.  The recent labs are stable. Continue same medication and careful diet. ------

## 2013-07-30 ENCOUNTER — Other Ambulatory Visit: Payer: Self-pay | Admitting: Cardiology

## 2013-08-27 ENCOUNTER — Other Ambulatory Visit: Payer: Self-pay | Admitting: Cardiology

## 2013-09-02 ENCOUNTER — Telehealth: Payer: Self-pay | Admitting: *Deleted

## 2013-09-02 DIAGNOSIS — E1165 Type 2 diabetes mellitus with hyperglycemia: Principal | ICD-10-CM

## 2013-09-02 DIAGNOSIS — IMO0001 Reserved for inherently not codable concepts without codable children: Secondary | ICD-10-CM

## 2013-09-02 NOTE — Telephone Encounter (Signed)
[facility] requests refill on test strips for patient. Would like #[phone]. Thanks, MI

## 2013-09-06 MED ORDER — GLUCOSE BLOOD VI STRP: ORAL_STRIP | Status: DC

## 2013-09-06 NOTE — Telephone Encounter (Signed)
Refilled as requested  

## 2013-10-01 ENCOUNTER — Other Ambulatory Visit: Payer: Self-pay | Admitting: Cardiology

## 2013-10-04 ENCOUNTER — Other Ambulatory Visit: Payer: Self-pay | Admitting: Cardiology

## 2013-10-07 ENCOUNTER — Other Ambulatory Visit (INDEPENDENT_AMBULATORY_CARE_PROVIDER_SITE_OTHER): Payer: Medicare Other

## 2013-10-07 DIAGNOSIS — I119 Hypertensive heart disease without heart failure: Secondary | ICD-10-CM

## 2013-10-07 DIAGNOSIS — E1165 Type 2 diabetes mellitus with hyperglycemia: Secondary | ICD-10-CM

## 2013-10-07 DIAGNOSIS — IMO0001 Reserved for inherently not codable concepts without codable children: Secondary | ICD-10-CM

## 2013-10-07 LAB — LIPID PANEL
CHOLESTEROL: 151 mg/dL (ref 0–200)
HDL: 43.8 mg/dL (ref >39.00)
LDL Cholesterol: 83 mg/dL (ref 0–99)
TRIGLYCERIDES: 121 mg/dL (ref 0.0–149.0)
Total CHOL/HDL Ratio: 3
VLDL: 24.2 mg/dL (ref 0.0–40.0)

## 2013-10-07 LAB — HEMOGLOBIN A1C: Hgb A1c MFr Bld: 7 % — ABNORMAL HIGH (ref 4.6–6.5)

## 2013-10-07 LAB — BASIC METABOLIC PANEL
BUN: 28 mg/dL — ABNORMAL HIGH (ref 6–23)
CO2: 29 mEq/L (ref 19–32)
CREATININE: 1.6 mg/dL — AB (ref 0.4–1.5)
Calcium: 9.2 mg/dL (ref 8.4–10.5)
Chloride: 103 mEq/L (ref 96–112)
GFR: 44.33 mL/min — AB (ref >60.00)
Glucose, Bld: 176 mg/dL — ABNORMAL HIGH (ref 70–99)
Potassium: 3.7 mEq/L (ref 3.5–5.1)
Sodium: 139 mEq/L (ref 135–145)

## 2013-10-07 LAB — HEPATIC FUNCTION PANEL
ALBUMIN: 3.9 g/dL (ref 3.5–5.2)
ALT: 13 U/L (ref 0–53)
AST: 16 U/L (ref 0–37)
Alkaline Phosphatase: 57 U/L (ref 39–117)
Bilirubin, Direct: 0.1 mg/dL (ref 0.0–0.3)
TOTAL PROTEIN: 7.5 g/dL (ref 6.0–8.3)
Total Bilirubin: 0.6 mg/dL (ref 0.3–1.2)

## 2013-10-07 NOTE — Progress Notes (Signed)
Quick Note:  Please make copy of labs for patient visit. ______ 

## 2013-10-08 ENCOUNTER — Ambulatory Visit (INDEPENDENT_AMBULATORY_CARE_PROVIDER_SITE_OTHER): Payer: Medicare Other | Admitting: Cardiology

## 2013-10-08 ENCOUNTER — Encounter: Payer: Self-pay | Admitting: Cardiology

## 2013-10-08 VITALS — BP 110/61 | HR 61 | Ht 69.0 in | Wt 176.0 lb

## 2013-10-08 DIAGNOSIS — I421 Obstructive hypertrophic cardiomyopathy: Secondary | ICD-10-CM

## 2013-10-08 DIAGNOSIS — IMO0001 Reserved for inherently not codable concepts without codable children: Secondary | ICD-10-CM

## 2013-10-08 DIAGNOSIS — I259 Chronic ischemic heart disease, unspecified: Secondary | ICD-10-CM

## 2013-10-08 DIAGNOSIS — E1165 Type 2 diabetes mellitus with hyperglycemia: Principal | ICD-10-CM

## 2013-10-08 DIAGNOSIS — Z8673 Personal history of transient ischemic attack (TIA), and cerebral infarction without residual deficits: Secondary | ICD-10-CM

## 2013-10-08 DIAGNOSIS — I119 Hypertensive heart disease without heart failure: Secondary | ICD-10-CM

## 2013-10-08 MED ORDER — NITROGLYCERIN 0.4 MG SL SUBL: 0.4000 mg | SUBLINGUAL_TABLET | SUBLINGUAL | Status: AC | PRN

## 2013-10-08 NOTE — Assessment & Plan Note (Signed)
The patient has not been having any dizziness or syncope or chest pain.  He does have chronic exertional dyspnea.  He is relatively inactive because of his prior strokes.

## 2013-10-08 NOTE — Assessment & Plan Note (Signed)
Blood pressure has been remaining stable.  His wife checks at home.  The patient does have mild ankle edema from the amlodipine.

## 2013-10-08 NOTE — Assessment & Plan Note (Signed)
The patient has not had any recurrent TIA or stroke symptoms. 

## 2013-10-08 NOTE — Patient Instructions (Signed)
Your physician recommends that you continue on your current medications as directed. Please refer to the Current Medication list given to you today.   Your physician wants you to follow-up in: 4 months with fasting labs (lp/bmet/hfp/a1c) and ekg You will receive a reminder letter in the mail two months in advance. If you don't receive a letter, please call our office to schedule the follow-up appointment.  

## 2013-10-08 NOTE — Assessment & Plan Note (Signed)
The patient has not had any recurrent angina.  His sublingual nitroglycerin was outdated and we will give him a new prescription

## 2013-10-08 NOTE — Progress Notes (Signed)
Melanee Leftonnie C Dais Date of Birth:  [birth year] [address] [place], [place]  [id number] [phone]         Fax   [phone]  History of Present Illness: This pleasant 80 year old gentleman is seen for a scheduled four-month followup office visit. He has a history of ischemic heart disease and a history of hypertrophic cardiomyopathy. He had a stent to his right coronary artery in 2001. He has a prior history of a stroke. He has a history of high blood pressure. He is an insulin-dependent diabetic and has hypercholesterolemia. Since last visit he's been doing fair.  He and his wife she celebrated their 60th wedding anniversary.   Current Outpatient Prescriptions  Medication Sig Dispense Refill  . amLODipine (NORVASC) 5 MG tablet TAKE 1 TABLET (5 MG TOTAL) BY MOUTH DAILY.  90 tablet  0  . B-D INS SYRINGE 0.5CC/30GX1/2" 30G X 1/2" 0.5 ML MISC USE AS DIRECTED  100 each  6  . clopidogrel (PLAVIX) 75 MG tablet TAKE 1 TABLET (75 MG TOTAL) BY MOUTH DAILY.  90 tablet  0  . fexofenadine (ALLEGRA) 60 MG tablet Take 1 tablet (60 mg total) by mouth daily.  90 tablet  3  . glucose blood test strip One Touch Ultra test strips testing 3 times a day and as needed. Diagnoses code 250.02  300 each  12  . hydrochlorothiazide (MICROZIDE) 12.5 MG capsule Take 1 capsule (12.5 mg total) by mouth daily.  90 capsule  3  . insulin NPH-insulin regular (HUMULIN 70/30) (70-30) 100 UNIT/ML injection Inject 28 Units into the skin daily.  10 mL  12  . isosorbide mononitrate (IMDUR) 60 MG 24 hr tablet Take 1 tablet (60 mg total) by mouth every morning.  90 tablet  3  . LANTUS 100 UNIT/ML injection INJECT 10 UNITS SUBCUTANEOUSLY AT BEDTIME  10 mL  3  . lovastatin (MEVACOR) 10 MG tablet TAKE 1 TABLET BY MOUTH AT BEDTIME  90 tablet  0  . metoprolol (LOPRESSOR) 100 MG tablet TAKE 1/2 TABLET BY MOUTH DAILY OR AS DIRECTED  45 tablet  0  . nitroGLYCERIN (NITROSTAT) 0.4 MG SL tablet Place 1 tablet (0.4 mg total)  under the tongue every 5 (five) minutes as needed.  25 tablet  prn  . ramipril (ALTACE) 10 MG capsule Take 1 capsule (10 mg total) by mouth daily.  90 capsule  3   No current facility-administered medications for this visit.    Allergies  Allergen Reactions  . Zyban [Bupropion Hcl]     Patient Active Problem List   Diagnosis Date Noted  . Ischemic heart disease 01/04/2011  . Hypertrophic obstructive cardiomyopathy 01/04/2011  . Cerebrovascular accident, old 01/04/2011  . Type II or unspecified type diabetes mellitus without mention of complication, uncontrolled 01/04/2011  . Hypercholesterolemia 01/04/2011  . Benign hypertensive heart disease without heart failure 01/04/2011    History  Smoking status  . Former Smoker -- 1.00 packs/day for 45 years  . Types: Cigarettes  . Quit date: 06/17/1993  Smokeless tobacco  . Not on file    History  Alcohol Use No    Family History  Problem Relation Age of Onset  . Arthritis Mother   . Heart disease Mother   . Hypertension Mother   . Diabetes Mother   . Heart disease Brother   . Diabetes Brother     Review of Systems: Constitutional: no fever chills diaphoresis or fatigue or change in weight.  Head and neck:  no hearing loss, no epistaxis, no photophobia or visual disturbance. Respiratory: No cough, shortness of breath or wheezing. Cardiovascular: No chest pain peripheral edema, palpitations. Gastrointestinal: No abdominal distention, no abdominal pain, no change in bowel habits hematochezia or melena. Genitourinary: No dysuria, no frequency, no urgency, no nocturia. Musculoskeletal:No arthralgias, no back pain, no gait disturbance or myalgias. Neurological: No dizziness, no headaches, no numbness, no seizures, no syncope, no weakness, no tremors. Hematologic: No lymphadenopathy, no easy bruising. Psychiatric: No confusion, no hallucinations, no sleep disturbance.    Physical Exam: Filed Vitals:   10/08/13 1356  BP:  110/61  Pulse: 61   general appearance reveals an elderly gentleman in no acute distress.  He walks with a cane because of his previous stroke.The head and neck exam reveals pupils equal and reactive.  Extraocular movements are full.  There is no scleral icterus.  The mouth and pharynx are normal.  The neck is supple.  The carotids reveal no bruits.  The jugular venous pressure is normal.  The  thyroid is not enlarged.  There is no lymphadenopathy.  The chest is clear to percussion and auscultation.  There are no rales or rhonchi.  Expansion of the chest is symmetrical.  The precordium is quiet.  The first heart sound is normal.  The second heart sound is physiologically split.  There is grade 2/6 systolic murmur of IHSS at the base. There is no abnormal lift or heave.  The abdomen is soft and nontender.  The bowel sounds are normal.  The liver and spleen are not enlarged.  There are no abdominal masses.  There are no abdominal bruits.  Extremities reveal good pedal pulses.  There is no phlebitis or edema.  There is no cyanosis or clubbing.  Strength is normal and symmetrical in all extremities.  There is no lateralizing weakness.  There are no sensory deficits.  The skin is warm and dry.  There is no rash.     Assessment / Plan: The patient is to continue same medication.  Lab work today shows improvement in his A1c.  His kidneys are drier and he needs to drink more water.  Recheck in 4 months for office visit, EKG, lipid panel hepatic function panel and basal metabolic panel and A1c

## 2013-10-22 ENCOUNTER — Other Ambulatory Visit: Payer: Self-pay | Admitting: Cardiology

## 2013-10-29 ENCOUNTER — Other Ambulatory Visit: Payer: Self-pay | Admitting: Cardiology

## 2013-11-03 ENCOUNTER — Other Ambulatory Visit: Payer: Self-pay | Admitting: Cardiology

## 2013-11-15 ENCOUNTER — Other Ambulatory Visit: Payer: Self-pay | Admitting: Cardiology

## 2013-11-20 ENCOUNTER — Other Ambulatory Visit: Payer: Self-pay | Admitting: Cardiology

## 2013-12-30 ENCOUNTER — Other Ambulatory Visit: Payer: Self-pay | Admitting: Cardiology

## 2014-01-22 ENCOUNTER — Other Ambulatory Visit: Payer: Self-pay | Admitting: Cardiology

## 2014-01-22 DIAGNOSIS — IMO0001 Reserved for inherently not codable concepts without codable children: Secondary | ICD-10-CM

## 2014-01-22 DIAGNOSIS — E1165 Type 2 diabetes mellitus with hyperglycemia: Principal | ICD-10-CM

## 2014-01-24 NOTE — Telephone Encounter (Signed)
Okay to refill the Humulin 70/30

## 2014-02-02 ENCOUNTER — Other Ambulatory Visit (INDEPENDENT_AMBULATORY_CARE_PROVIDER_SITE_OTHER): Payer: Medicare Other

## 2014-02-02 DIAGNOSIS — IMO0001 Reserved for inherently not codable concepts without codable children: Secondary | ICD-10-CM

## 2014-02-02 DIAGNOSIS — I119 Hypertensive heart disease without heart failure: Secondary | ICD-10-CM

## 2014-02-02 DIAGNOSIS — E1165 Type 2 diabetes mellitus with hyperglycemia: Principal | ICD-10-CM

## 2014-02-02 LAB — BASIC METABOLIC PANEL
BUN: 21 mg/dL (ref 6–23)
CALCIUM: 9.3 mg/dL (ref 8.4–10.5)
CO2: 27 mEq/L (ref 19–32)
Chloride: 102 mEq/L (ref 96–112)
Creatinine, Ser: 1.6 mg/dL — ABNORMAL HIGH (ref 0.4–1.5)
GFR: 44.3 mL/min — AB (ref >60.00)
Glucose, Bld: 94 mg/dL (ref 70–99)
POTASSIUM: 4.1 meq/L (ref 3.5–5.1)
Sodium: 139 mEq/L (ref 135–145)

## 2014-02-02 LAB — LIPID PANEL
CHOL/HDL RATIO: 3
Cholesterol: 157 mg/dL (ref 0–200)
HDL: 46.2 mg/dL (ref >39.00)
LDL Cholesterol: 85 mg/dL (ref 0–99)
NONHDL: 110.8
Triglycerides: 128 mg/dL (ref 0.0–149.0)
VLDL: 25.6 mg/dL (ref 0.0–40.0)

## 2014-02-02 LAB — HEPATIC FUNCTION PANEL
ALT: 13 U/L (ref 0–53)
AST: 16 U/L (ref 0–37)
Albumin: 3.8 g/dL (ref 3.5–5.2)
Alkaline Phosphatase: 59 U/L (ref 39–117)
Bilirubin, Direct: 0 mg/dL (ref 0.0–0.3)
TOTAL PROTEIN: 7.6 g/dL (ref 6.0–8.3)
Total Bilirubin: 0.5 mg/dL (ref 0.2–1.2)

## 2014-02-02 LAB — HEMOGLOBIN A1C: Hgb A1c MFr Bld: 7.3 % — ABNORMAL HIGH (ref 4.6–6.5)

## 2014-02-02 NOTE — Progress Notes (Signed)
Quick Note:  Please make copy of labs for patient visit. ______ 

## 2014-02-03 ENCOUNTER — Telehealth: Payer: Self-pay | Admitting: *Deleted

## 2014-02-03 ENCOUNTER — Other Ambulatory Visit: Payer: Self-pay | Admitting: Cardiology

## 2014-02-03 NOTE — Telephone Encounter (Signed)
Pt's wife notified about lab results Hgb A1c 7.3. Pt verified appt 8/24 w/Dr. Patty SermonsBrackbill.

## 2014-02-07 ENCOUNTER — Encounter: Payer: Self-pay | Admitting: Cardiology

## 2014-02-07 ENCOUNTER — Ambulatory Visit (INDEPENDENT_AMBULATORY_CARE_PROVIDER_SITE_OTHER): Payer: Medicare Other | Admitting: Cardiology

## 2014-02-07 VITALS — BP 110/56 | HR 68 | Ht 69.0 in | Wt 179.0 lb

## 2014-02-07 DIAGNOSIS — IMO0001 Reserved for inherently not codable concepts without codable children: Secondary | ICD-10-CM

## 2014-02-07 DIAGNOSIS — E7800 Pure hypercholesterolemia, unspecified: Secondary | ICD-10-CM

## 2014-02-07 DIAGNOSIS — I119 Hypertensive heart disease without heart failure: Secondary | ICD-10-CM

## 2014-02-07 DIAGNOSIS — I259 Chronic ischemic heart disease, unspecified: Secondary | ICD-10-CM

## 2014-02-07 DIAGNOSIS — E1165 Type 2 diabetes mellitus with hyperglycemia: Principal | ICD-10-CM

## 2014-02-07 NOTE — Assessment & Plan Note (Signed)
Blood pressures remaining stable on current therapy.  No increased exertional dyspnea.  No dizziness or syncope

## 2014-02-07 NOTE — Assessment & Plan Note (Signed)
The patient has a history of hypertrophic obstructive cardiomyopathy.  He has a grade 2/6 musical systolic murmur at the base.  He has not been experiencing any dizziness or syncope.  No chest pain.

## 2014-02-07 NOTE — Progress Notes (Signed)
[name] Date of Birth:  10/16/1930 [facility] [address] [place], [place]  [address] [phone]        Fax   [phone]   History of Present Illness: This pleasant 80 year old gentleman is seen for a scheduled four-month followup office visit. He has a history of ischemic heart disease and a history of hypertrophic cardiomyopathy. He had a stent to his right coronary artery in 2001. He has a prior history of a stroke. He has a history of high blood pressure. He is an insulin-dependent diabetic and has hypercholesterolemia. Since last visit he's been doing fair.  He has occasional symptoms of indigestion.  He states it is mild and and he does not need any treatment for it and it goes away within 10 minutes.   Current Outpatient Prescriptions  Medication Sig Dispense Refill  . amLODipine (NORVASC) 5 MG tablet TAKE 1 TABLET BY MOUTH EVERY DAY  90 tablet  0  . B-D INS SYRINGE 0.5CC/30GX1/2" 30G X 1/2" 0.5 ML MISC USE AS DIRECTED  100 each  6  . clopidogrel (PLAVIX) 75 MG tablet TAKE 1 TABLET (75 MG TOTAL) BY MOUTH DAILY.  90 tablet  0  . fexofenadine (ALLEGRA) 60 MG tablet TAKE 1 TABLET (60 MG TOTAL) BY MOUTH DAILY.  90 tablet  3  . glucose blood test strip One Touch Ultra test strips testing 3 times a day and as needed. Diagnoses code 250.02  300 each  12  . HUMULIN 70/30 (70-30) 100 UNIT/ML injection INJECT 30 UNITS INTO THE SKIN DAILY.  10 mL  3  . hydrochlorothiazide (MICROZIDE) 12.5 MG capsule TAKE 1 CAPSULE (12.5 MG TOTAL) BY MOUTH DAILY.  90 capsule  3  . isosorbide mononitrate (IMDUR) 60 MG 24 hr tablet TAKE 1 TABLET BY MOUTH IN THE MORNING  90 tablet  1  . LANTUS 100 UNIT/ML injection INJECT 10 UNITS SUBCUTANEOUSLY AT BEDTIME  10 mL  3  . lovastatin (MEVACOR) 10 MG tablet TAKE 1 TABLET BY MOUTH AT BEDTIME  90 tablet  0  . metoprolol (LOPRESSOR) 100 MG tablet TAKE 1/2 TABLET BY MOUTH DAILY OR AS DIRECTED  45 tablet  3  . nitroGLYCERIN  (NITROSTAT) 0.4 MG SL tablet Place 1 tablet (0.4 mg total) under the tongue every 5 (five) minutes as needed.  25 tablet  prn  . ramipril (ALTACE) 10 MG capsule TAKE 1 CAPSULE (10 MG TOTAL) BY MOUTH DAILY.  90 capsule  3  . insulin NPH-insulin regular (HUMULIN 70/30) (70-30) 100 UNIT/ML injection Inject 28 Units into the skin daily.  10 mL  12   No current facility-administered medications for this visit.    Allergies  Allergen Reactions  . Zyban [Bupropion Hcl]     Patient Active Problem List   Diagnosis Date Noted  . Ischemic heart disease 01/04/2011  . Hypertrophic obstructive cardiomyopathy 01/04/2011  . Cerebrovascular accident, old 01/04/2011  . Type II or unspecified type diabetes mellitus without mention of complication, uncontrolled 01/04/2011  . Hypercholesterolemia 01/04/2011  . Benign hypertensive heart disease without heart failure 01/04/2011    History  Smoking status  . Former Smoker -- 1.00 packs/day for 45 years  . Types: Cigarettes  . Quit date: 06/17/1993  Smokeless tobacco  . Not on file    History  Alcohol Use No    Family History  Problem Relation Age of Onset  . Arthritis Mother   . Heart disease Mother   . Hypertension  Mother   . Diabetes Mother   . Heart disease Brother   . Diabetes Brother     Review of Systems: Constitutional: no fever chills diaphoresis or fatigue or change in weight.  Head and neck: no hearing loss, no epistaxis, no photophobia or visual disturbance. Respiratory: No cough, shortness of breath or wheezing. Cardiovascular: No chest pain peripheral edema, palpitations. Gastrointestinal: No abdominal distention, no abdominal pain, no change in bowel habits hematochezia or melena. Genitourinary: No dysuria, no frequency, no urgency, no nocturia. Musculoskeletal:No arthralgias, no back pain, no gait disturbance or myalgias. Neurological: No dizziness, no headaches, no numbness, no seizures, no syncope, no weakness, no  tremors. Hematologic: No lymphadenopathy, no easy bruising. Psychiatric: No confusion, no hallucinations, no sleep disturbance.    Physical Exam: Filed Vitals:   02/07/14 1630  BP: 110/56  Pulse: 68   the general appearance reveals a well-developed well-nourished gentleman in no distress.The head and neck exam reveals pupils equal and reactive.  Extraocular movements are full.  There is no scleral icterus.  The mouth and pharynx are normal.  The neck is supple.  The carotids reveal no bruits.  The jugular venous pressure is normal.  The  thyroid is not enlarged.  There is no lymphadenopathy.  The chest is clear to percussion and auscultation.  There are no rales or rhonchi.  Expansion of the chest is symmetrical.  The precordium is quiet.  The first heart sound is normal.  The second heart sound is physiologically split.  There is grade 2/6 musical systolic ejection murmur at Reed sternal edge and base.  No diastolic murmur. There is no abnormal lift or heave.  The abdomen is soft and nontender.  The bowel sounds are normal.  The liver and spleen are not enlarged.  There are no abdominal masses.  There are no abdominal bruits.  Extremities reveal good pedal pulses.  There is no phlebitis or edema.  There is no cyanosis or clubbing.  Strength is normal and symmetrical in all extremities.  There is no lateralizing weakness.  There are no sensory deficits.  The skin is warm and dry.  There is no rash.     Assessment / Plan: 1. ischemic heart disease status post stent to his right coronary artery in 2001 2. essential hypertension 3. hypertrophic obstructive cardiomyopathy with systolic murmur at base 4. old cerebrovascular accident 5. insulin-dependent diabetes mellitus.  Last A1c is 7.2  Physician: Continue same medication.  Recheck in 4 months for office visit lipid panel hepatic function panel basal metabolic panel and A1c.  His weight is up 3 pounds.  Watch diet carefully.Marland Kitchen

## 2014-02-07 NOTE — Patient Instructions (Signed)
Your physician recommends that you continue on your current medications as directed. Please refer to the Current Medication list given to you today.  Your physician recommends that you schedule a follow-up appointment in: 4 months with fasting labs (lp/bmet/hfp/a1c)  

## 2014-02-07 NOTE — Assessment & Plan Note (Signed)
The patient is on Mevacor.  Is not having any myalgias.  Blood work is satisfactory

## 2014-02-28 ENCOUNTER — Other Ambulatory Visit: Payer: Self-pay | Admitting: Cardiology

## 2014-04-01 ENCOUNTER — Other Ambulatory Visit: Payer: Self-pay | Admitting: Cardiology

## 2014-04-22 ENCOUNTER — Other Ambulatory Visit: Payer: Self-pay | Admitting: Cardiology

## 2014-04-22 MED ORDER — ISOSORBIDE MONONITRATE ER 60 MG PO TB24: ORAL_TABLET | ORAL | Status: DC

## 2014-05-04 ENCOUNTER — Other Ambulatory Visit: Payer: Self-pay | Admitting: Cardiology

## 2014-05-19 ENCOUNTER — Telehealth: Payer: Self-pay | Admitting: *Deleted

## 2014-05-19 NOTE — Telephone Encounter (Signed)
New Message  Pt received vacc on Nov 3 at [facility] 

## 2014-06-01 ENCOUNTER — Ambulatory Visit (INDEPENDENT_AMBULATORY_CARE_PROVIDER_SITE_OTHER): Payer: Medicare Other | Admitting: Cardiology

## 2014-06-01 ENCOUNTER — Other Ambulatory Visit (INDEPENDENT_AMBULATORY_CARE_PROVIDER_SITE_OTHER): Payer: Medicare Other | Admitting: *Deleted

## 2014-06-01 ENCOUNTER — Encounter: Payer: Self-pay | Admitting: Cardiology

## 2014-06-01 VITALS — BP 128/70 | HR 66 | Ht 69.0 in | Wt 174.4 lb

## 2014-06-01 DIAGNOSIS — I119 Hypertensive heart disease without heart failure: Secondary | ICD-10-CM

## 2014-06-01 DIAGNOSIS — E7800 Pure hypercholesterolemia, unspecified: Secondary | ICD-10-CM

## 2014-06-01 DIAGNOSIS — IMO0002 Reserved for concepts with insufficient information to code with codable children: Secondary | ICD-10-CM

## 2014-06-01 DIAGNOSIS — E1165 Type 2 diabetes mellitus with hyperglycemia: Secondary | ICD-10-CM

## 2014-06-01 DIAGNOSIS — E0859 Diabetes mellitus due to underlying condition with other circulatory complications: Secondary | ICD-10-CM

## 2014-06-01 DIAGNOSIS — I259 Chronic ischemic heart disease, unspecified: Secondary | ICD-10-CM

## 2014-06-01 DIAGNOSIS — I422 Other hypertrophic cardiomyopathy: Secondary | ICD-10-CM

## 2014-06-01 LAB — HEPATIC FUNCTION PANEL
ALBUMIN: 4.1 g/dL (ref 3.5–5.2)
ALT: 11 U/L (ref 0–53)
AST: 14 U/L (ref 0–37)
Alkaline Phosphatase: 53 U/L (ref 39–117)
BILIRUBIN DIRECT: 0.1 mg/dL (ref 0.0–0.3)
TOTAL PROTEIN: 7.5 g/dL (ref 6.0–8.3)
Total Bilirubin: 0.4 mg/dL (ref 0.2–1.2)

## 2014-06-01 LAB — HEMOGLOBIN A1C: HEMOGLOBIN A1C: 7.1 % — AB (ref 4.6–6.5)

## 2014-06-01 LAB — LIPID PANEL
CHOLESTEROL: 157 mg/dL (ref 0–200)
HDL: 40.3 mg/dL (ref >39.00)
LDL CALC: 86 mg/dL (ref 0–99)
NonHDL: 116.7
TRIGLYCERIDES: 154 mg/dL — AB (ref 0.0–149.0)
Total CHOL/HDL Ratio: 4
VLDL: 30.8 mg/dL (ref 0.0–40.0)

## 2014-06-01 LAB — BASIC METABOLIC PANEL
BUN: 30 mg/dL — ABNORMAL HIGH (ref 6–23)
CO2: 28 mEq/L (ref 19–32)
Calcium: 9.1 mg/dL (ref 8.4–10.5)
Chloride: 105 mEq/L (ref 96–112)
Creatinine, Ser: 1.6 mg/dL — ABNORMAL HIGH (ref 0.4–1.5)
GFR: 43.63 mL/min — AB (ref >60.00)
Glucose, Bld: 103 mg/dL — ABNORMAL HIGH (ref 70–99)
Potassium: 3.8 mEq/L (ref 3.5–5.1)
SODIUM: 139 meq/L (ref 135–145)

## 2014-06-01 NOTE — Assessment & Plan Note (Signed)
Blood pressure is remaining stable on current therapy.  No dizziness or syncope. 

## 2014-06-01 NOTE — Assessment & Plan Note (Signed)
The patient has not been having any hypoglycemic episodes 

## 2014-06-01 NOTE — Patient Instructions (Addendum)
Your physician recommends that you continue on your current medications as directed. Please refer to the Current Medication list given to you today.  Your physician wants you to follow-up in: 4 months with fasting labs (lp/bmet/hfp/a1c)  You will receive a reminder letter in the mail two months in advance. If you don't receive a letter, please call our office to schedule the follow-up appointment.  

## 2014-06-01 NOTE — Progress Notes (Signed)
[name] Date of Birth:  1931-05-03 [facility] [address] [place], [place]  [id number] [phone]        Fax   [phone]   History of Present Illness: This pleasant 80 year old gentleman is seen for a scheduled four-month followup office visit. He has a history of ischemic heart disease and a history of hypertrophic cardiomyopathy. He had a stent to his right coronary artery in 2001. He has a prior history of a stroke. He has a history of high blood pressure. He is an insulin-dependent diabetic and has hypercholesterolemia. Since last visit he's been doing fair.  He has occasional symptoms of indigestion.  He states it is mild and and he does not need any treatment for it and it goes away within 10 minutes.   Current Outpatient Prescriptions  Medication Sig Dispense Refill  . amLODipine (NORVASC) 5 MG tablet TAKE 1 TABLET BY MOUTH EVERY DAY 90 tablet 1  . B-D INS SYRINGE 0.5CC/30GX1/2" 30G X 1/2" 0.5 ML MISC USE AS DIRECTED 100 each 6  . clopidogrel (PLAVIX) 75 MG tablet TAKE 1 TABLET BY MOUTH EVERY DAY 90 tablet 1  . fexofenadine (ALLEGRA) 60 MG tablet TAKE 1 TABLET (60 MG TOTAL) BY MOUTH DAILY. 90 tablet 3  . FLUZONE HIGH-DOSE 0.5 ML SUSY   0  . glucose blood test strip One Touch Ultra test strips testing 3 times a day and as needed. Diagnoses code 250.02 300 each 12  . HUMULIN 70/30 (70-30) 100 UNIT/ML injection INJECT 30 UNITS INTO THE SKIN DAILY. 10 mL 3  . hydrochlorothiazide (MICROZIDE) 12.5 MG capsule TAKE 1 CAPSULE (12.5 MG TOTAL) BY MOUTH DAILY. 90 capsule 3  . insulin NPH-insulin regular (HUMULIN 70/30) (70-30) 100 UNIT/ML injection Inject 28 Units into the skin daily. 10 mL 12  . isosorbide mononitrate (IMDUR) 60 MG 24 hr tablet TAKE 1 TABLET BY MOUTH IN THE MORNING 90 tablet 1  . LANTUS 100 UNIT/ML injection INJECT 10 UNITS SUBCUTANEOUSLY AT BEDTIME 10 mL 3  . lovastatin (MEVACOR) 10 MG tablet TAKE 1 TABLET BY MOUTH AT BEDTIME 90 tablet  0  . metoprolol (LOPRESSOR) 100 MG tablet TAKE 1/2 TABLET BY MOUTH DAILY OR AS DIRECTED 45 tablet 3  . nitroGLYCERIN (NITROSTAT) 0.4 MG SL tablet Place 1 tablet (0.4 mg total) under the tongue every 5 (five) minutes as needed. 25 tablet prn  . ramipril (ALTACE) 10 MG capsule TAKE 1 CAPSULE (10 MG TOTAL) BY MOUTH DAILY. 90 capsule 3   No current facility-administered medications for this visit.    Allergies  Allergen Reactions  . Zyban [Bupropion Hcl]     Patient Active Problem List   Diagnosis Date Noted  . Ischemic heart disease 01/04/2011  . Hypertrophic obstructive cardiomyopathy 01/04/2011  . Cerebrovascular accident, old 01/04/2011  . Type II or unspecified type diabetes mellitus without mention of complication, uncontrolled 01/04/2011  . Hypercholesterolemia 01/04/2011  . Benign hypertensive heart disease without heart failure 01/04/2011    History  Smoking status  . Former Smoker -- 1.00 packs/day for 45 years  . Types: Cigarettes  . Quit date: 06/17/1993  Smokeless tobacco  . Not on file    History  Alcohol Use No    Family History  Problem Relation Age of Onset  . Arthritis Mother   . Heart disease Mother   . Hypertension Mother   . Diabetes Mother   . Heart disease Brother   . Diabetes Brother  Review of Systems: Constitutional: no fever chills diaphoresis or fatigue or change in weight.  Head and neck: no hearing loss, no epistaxis, no photophobia or visual disturbance. Respiratory: No cough, shortness of breath or wheezing. Cardiovascular: No chest pain peripheral edema, palpitations. Gastrointestinal: No abdominal distention, no abdominal pain, no change in bowel habits hematochezia or melena. Genitourinary: No dysuria, no frequency, no urgency, no nocturia. Musculoskeletal:No arthralgias, no back pain, no gait disturbance or myalgias. Neurological: No dizziness, no headaches, no numbness, no seizures, no syncope, no weakness, no  tremors. Hematologic: No lymphadenopathy, no easy bruising. Psychiatric: No confusion, no hallucinations, no sleep disturbance.    Physical Exam: Filed Vitals:   06/01/14 0855  BP: 128/70  Pulse: 66   the general appearance reveals a well-developed well-nourished gentleman in no distress.The head and neck exam reveals pupils equal and reactive.  Extraocular movements are full.  There is no scleral icterus.  The mouth and pharynx are normal.  The neck is supple.  The carotids reveal no bruits.  The jugular venous pressure is normal.  The  thyroid is not enlarged.  There is no lymphadenopathy.  The chest is clear to percussion and auscultation.  There are no rales or rhonchi.  Expansion of the chest is symmetrical.  The precordium is quiet.  The first heart sound is normal.  The second heart sound is physiologically split.  There is grade 2/6 musical systolic ejection murmur at left sternal edge and base.  No diastolic murmur. There is no abnormal lift or heave.  The abdomen is soft and nontender.  The bowel sounds are normal.  The liver and spleen are not enlarged.  There are no abdominal masses.  There are no abdominal bruits.  Extremities reveal good pedal pulses.  There is no phlebitis or edema.  There is no cyanosis or clubbing.  Strength is normal and symmetrical in all extremities.  There is no lateralizing weakness.  There are no sensory deficits.  The skin is warm and dry.  There is no rash.  EKG today shows sinus rhythm with first-degree AV block and frequent PVCs.  Since last tracing 10/16/12, PVCs are new.   Assessment / Plan: 1. ischemic heart disease status post stent to his right coronary artery in 2001 2. essential hypertension 3. hypertrophic obstructive cardiomyopathy with systolic murmur at base 4. old cerebrovascular accident 5. insulin-dependent diabetes mellitus.  Last A1c is 7.2 6.  Asymptomatic PVCs  Physician: Continue same medication.  Recheck in 4 months for office  visit lipid panel hepatic function panel basal metabolic panel and A1c.  His weight is down 5 pounds.  Continue careful diet.

## 2014-06-01 NOTE — Assessment & Plan Note (Signed)
No chest pain or shortness of breath.  No dizziness or syncope. 

## 2014-06-01 NOTE — Assessment & Plan Note (Signed)
No angina pectoris.  No sublingual nitroglycerin use.

## 2014-06-02 NOTE — Progress Notes (Signed)
Quick Note:  Please report to patient. The recent labs are stable. Continue same medication and careful diet. The A1c is slightly better ______

## 2014-06-16 ENCOUNTER — Other Ambulatory Visit: Payer: Self-pay | Admitting: Cardiology

## 2014-06-16 ENCOUNTER — Telehealth: Payer: Self-pay | Admitting: *Deleted

## 2014-06-16 NOTE — Telephone Encounter (Signed)
Patient requesting refill on Humulin 70/30. Please advise.

## 2014-06-16 NOTE — Telephone Encounter (Signed)
Okay to refill his insulin

## 2014-06-21 ENCOUNTER — Other Ambulatory Visit: Payer: Self-pay | Admitting: *Deleted

## 2014-06-21 ENCOUNTER — Other Ambulatory Visit: Payer: Self-pay | Admitting: Cardiology

## 2014-06-21 MED ORDER — INSULIN NPH ISOPHANE & REGULAR (70-30) 100 UNIT/ML ~~LOC~~ SUSP: 28.0000 [IU] | Freq: Every day | SUBCUTANEOUS | Status: AC

## 2014-07-21 ENCOUNTER — Other Ambulatory Visit: Payer: Self-pay | Admitting: Cardiology

## 2014-07-21 NOTE — Telephone Encounter (Signed)
Okay to refill his insulin syringes.

## 2014-08-05 ENCOUNTER — Other Ambulatory Visit: Payer: Self-pay | Admitting: Cardiology

## 2014-09-23 ENCOUNTER — Other Ambulatory Visit: Payer: Self-pay | Admitting: Cardiology

## 2014-09-26 ENCOUNTER — Other Ambulatory Visit: Payer: Self-pay | Admitting: Cardiology

## 2014-10-05 ENCOUNTER — Ambulatory Visit (INDEPENDENT_AMBULATORY_CARE_PROVIDER_SITE_OTHER): Payer: Medicare Other | Admitting: Cardiology

## 2014-10-05 ENCOUNTER — Other Ambulatory Visit (INDEPENDENT_AMBULATORY_CARE_PROVIDER_SITE_OTHER): Payer: Medicare Other | Admitting: *Deleted

## 2014-10-05 ENCOUNTER — Encounter: Payer: Self-pay | Admitting: Cardiology

## 2014-10-05 VITALS — BP 158/66 | HR 55 | Ht 69.0 in | Wt 173.8 lb

## 2014-10-05 DIAGNOSIS — I119 Hypertensive heart disease without heart failure: Secondary | ICD-10-CM

## 2014-10-05 DIAGNOSIS — E7800 Pure hypercholesterolemia, unspecified: Secondary | ICD-10-CM

## 2014-10-05 DIAGNOSIS — E0859 Diabetes mellitus due to underlying condition with other circulatory complications: Secondary | ICD-10-CM | POA: Diagnosis not present

## 2014-10-05 DIAGNOSIS — I422 Other hypertrophic cardiomyopathy: Secondary | ICD-10-CM

## 2014-10-05 DIAGNOSIS — I493 Ventricular premature depolarization: Secondary | ICD-10-CM

## 2014-10-05 LAB — HEPATIC FUNCTION PANEL
ALBUMIN: 4 g/dL (ref 3.5–5.2)
ALT: 9 U/L (ref 0–53)
AST: 14 U/L (ref 0–37)
Alkaline Phosphatase: 56 U/L (ref 39–117)
Bilirubin, Direct: 0.1 mg/dL (ref 0.0–0.3)
Total Bilirubin: 0.5 mg/dL (ref 0.2–1.2)
Total Protein: 7.2 g/dL (ref 6.0–8.3)

## 2014-10-05 LAB — BASIC METABOLIC PANEL
BUN: 39 mg/dL — ABNORMAL HIGH (ref 6–23)
CALCIUM: 9.5 mg/dL (ref 8.4–10.5)
CO2: 28 meq/L (ref 19–32)
Chloride: 106 mEq/L (ref 96–112)
Creatinine, Ser: 1.79 mg/dL — ABNORMAL HIGH (ref 0.40–1.50)
GFR: 38.85 mL/min — ABNORMAL LOW (ref >60.00)
GLUCOSE: 140 mg/dL — AB (ref 70–99)
Potassium: 3.8 mEq/L (ref 3.5–5.1)
Sodium: 141 mEq/L (ref 135–145)

## 2014-10-05 LAB — LIPID PANEL
CHOL/HDL RATIO: 3
CHOLESTEROL: 131 mg/dL (ref 0–200)
HDL: 43.1 mg/dL (ref >39.00)
LDL Cholesterol: 68 mg/dL (ref 0–99)
NONHDL: 87.9
Triglycerides: 101 mg/dL (ref 0.0–149.0)
VLDL: 20.2 mg/dL (ref 0.0–40.0)

## 2014-10-05 LAB — HEMOGLOBIN A1C: Hgb A1c MFr Bld: 7 % — ABNORMAL HIGH (ref 4.6–6.5)

## 2014-10-05 NOTE — Progress Notes (Signed)
Cardiology Office Note   Date:  10/05/2014   ID:  [name], DOB 1931-04-20, MRN [id number]  PCP:  Cassell Clementhomas Shiana Rappleye, MD  Cardiologist:   Cassell Clementhomas Laiklyn Pilkenton, MD   No chief complaint on file.     History of Present Illness: [name] is a 81 y.o. male who presents for a four-month follow-up office visit.  This pleasant 81 year old gentleman is seen for a scheduled four-month followup office visit. He has a history of ischemic heart disease and a history of hypertrophic cardiomyopathy. He had a stent to his right coronary artery in 2001. He has a prior history of a stroke. He has a history of high blood pressure. He is an insulin-dependent diabetic and has hypercholesterolemia. Since last visit he's been doing fair. He has occasional symptoms of indigestion. He states it is mild and and he does not need any treatment for it and it goes away within 10 minutes.  The patient has not had any recurrent TIA symptoms.  He has not been aware of any palpitations or racing of his heart.  He denies chest pain dizziness or syncope.  He has mild chronic exertional dyspnea.  He no longer does yard work.  His son mows the grass and keeps up the yard for him.  Past Medical History  Diagnosis Date  . Hypertension   . Hyperlipidemia   . Diabetes mellitus     type 2  . Cerebrovascular accident     Old  . Hypertrophic obstructive cardiomyopathy     hystory of   . Peripheral edema   . Hypokalemia   . Acute inferolateral myocardial infarction 12/23/99  . Coronary artery disease     cardiac catheterization in 2001 - Successful PTCA and stenting of the mid right coronary artery -- Well preserved left ventricular systolic function -- severe three-vessel coronary artery disease including a severe disease involving the right coronary artery     Past Surgical History  Procedure Laterality Date  . Cholecystectomy    . Colectomy    . Tonsillectomy    . Cardiac catheterization  12/23/99   Successful PTCA and stenting of the mid right coronary artery -- Well preserved left ventricular systolic function -- severe three-vessel coronary artery disease including a severe disease involving the right coronary artery      Current Outpatient Prescriptions  Medication Sig Dispense Refill  . amLODipine (NORVASC) 5 MG tablet TAKE 1 TABLET BY MOUTH EVERY DAY 90 tablet 0  . B-D INS SYRINGE 0.5CC/30GX1/2" 30G X 1/2" 0.5 ML MISC USE AS DIRECTED 100 each prn  . clopidogrel (PLAVIX) 75 MG tablet TAKE 1 TABLET BY MOUTH EVERY DAY 90 tablet 0  . fexofenadine (ALLEGRA) 60 MG tablet TAKE 1 TABLET (60 MG TOTAL) BY MOUTH DAILY. 90 tablet 3  . FLUZONE HIGH-DOSE 0.5 ML SUSY   0  . glucose blood test strip One Touch Ultra test strips testing 3 times a day and as needed. Diagnoses code 250.02 300 each 12  . hydrochlorothiazide (MICROZIDE) 12.5 MG capsule TAKE 1 CAPSULE (12.5 MG TOTAL) BY MOUTH DAILY. 90 capsule 3  . insulin NPH-regular Human (HUMULIN 70/30) (70-30) 100 UNIT/ML injection Inject 28 Units into the skin daily. 10 mL 12  . isosorbide mononitrate (IMDUR) 60 MG 24 hr tablet TAKE 1 TABLET BY MOUTH IN THE MORNING 90 tablet 1  . LANTUS 100 UNIT/ML injection INJECT 10 UNITS SUBCUTANEOUSLY AT BEDTIME 10 mL 3  . lovastatin (MEVACOR) 10 MG tablet TAKE 1  TABLET BY MOUTH AT BEDTIME 90 tablet 0  . metoprolol (LOPRESSOR) 100 MG tablet TAKE 1/2 TABLET BY MOUTH DAILY OR AS DIRECTED 45 tablet 3  . nitroGLYCERIN (NITROSTAT) 0.4 MG SL tablet Place 1 tablet (0.4 mg total) under the tongue every 5 (five) minutes as needed. 25 tablet prn  . ramipril (ALTACE) 10 MG capsule TAKE 1 CAPSULE (10 MG TOTAL) BY MOUTH DAILY. 90 capsule 3   No current facility-administered medications for this visit.    Allergies:   Zyban    Social History:  The patient  reports that he quit smoking about 21 years ago. His smoking use included Cigarettes. He has a 45 pack-year smoking history. He does not have any smokeless tobacco  history on file. He reports that he does not drink alcohol or use illicit drugs.   Family History:  The patient's family history includes Arthritis in his mother; Diabetes in his brother and mother; Heart disease in his brother and mother; Hypertension in his mother.    ROS:  Please see the history of present illness.   Otherwise, review of systems are positive for none.   All other systems are reviewed and negative.    PHYSICAL EXAM: VS:  BP 158/66 mmHg  Pulse 55  Ht  (1.753 m)  Wt 173 lb 12.8 oz (78.835 kg)  BMI 25.65 kg/m2 , BMI Body mass index is 25.65 kg/(m^2). GEN: Well nourished, well developed, in no acute distress HEENT: normal Neck: no JVD, carotid bruits, or masses Cardiac: RRR with PVCs.  Grade 2/6 systolic ejection murmur at the base.  No rubs, or gallops, trace edema  Respiratory:  clear to auscultation bilaterally, normal work of breathing GI: soft, nontender, nondistended, + BS MS: no deformity or atrophy Skin: warm and dry, no rash Neuro:  Strength and sensation are intact Psych: euthymic mood, full affect   EKG:  EKG is not ordered today.   Recent Labs: 06/01/2014: ALT 11; BUN 30*; Creatinine 1.6*; Potassium 3.8; Sodium 139    Lipid Panel    Component Value Date/Time   CHOL 157 06/01/2014 0821   TRIG 154.0* 06/01/2014 0821   HDL 40.30 06/01/2014 0821   CHOLHDL 4 06/01/2014 0821   VLDL 30.8 06/01/2014 0821   LDLCALC 86 06/01/2014 0821      Wt Readings from Last 3 Encounters:  10/05/14 173 lb 12.8 oz (78.835 kg)  06/01/14 174 lb 6.4 oz (79.107 kg)  02/07/14 179 lb (81.194 kg)        ASSESSMENT AND PLAN:  1. ischemic heart disease status post stent to his right coronary artery in 2001 2. essential hypertension 3. hypertrophic obstructive cardiomyopathy with systolic murmur at base 4. old cerebrovascular accident 5. insulin-dependent diabetes mellitus.  6. Asymptomatic PVCs  Physician: Continue same medication. Recheck in 4 months  for office visit EKG lipid panel hepatic function panel basal metabolic panel and A1c.   Continue careful diet.   Current medicines are reviewed at length with the patient today.  The patient does not have concerns regarding medicines.  The following changes have been made:  no change  Labs/ tests ordered today include:   Orders Placed This Encounter  Procedures  . Lipid panel  . Hepatic function panel  . Basic metabolic panel  . Hemoglobin A1c      Signed, Cassell Clement, MD  10/05/2014 10:16 AM    [facility] [address] [place], [place], [place]  [address] Phone: [phone]; Fax: [phone]

## 2014-10-05 NOTE — Patient Instructions (Signed)
Medication Instructions:  Your physician recommends that you continue on your current medications as directed. Please refer to the Current Medication list given to you today.  Labwork: Already done   Testing/Procedures: none  Follow-Up: Your physician recommends that you schedule a follow-up appointment in: 4 months with fasting labs (lp/bmet/hfp/a1c) and ekg

## 2014-10-13 ENCOUNTER — Other Ambulatory Visit: Payer: Self-pay | Admitting: Cardiology

## 2014-10-17 ENCOUNTER — Other Ambulatory Visit: Payer: Self-pay | Admitting: Cardiology

## 2014-11-03 ENCOUNTER — Other Ambulatory Visit: Payer: Self-pay | Admitting: Cardiology

## 2014-11-09 ENCOUNTER — Other Ambulatory Visit: Payer: Self-pay | Admitting: Cardiology

## 2014-11-10 ENCOUNTER — Other Ambulatory Visit: Payer: Self-pay | Admitting: Cardiology

## 2014-12-22 ENCOUNTER — Other Ambulatory Visit: Payer: Self-pay | Admitting: Cardiology

## 2015-01-25 ENCOUNTER — Other Ambulatory Visit: Payer: Self-pay | Admitting: Cardiology

## 2015-01-26 ENCOUNTER — Other Ambulatory Visit: Payer: Self-pay | Admitting: Cardiology

## 2015-01-26 DIAGNOSIS — R0981 Nasal congestion: Secondary | ICD-10-CM

## 2015-02-07 ENCOUNTER — Ambulatory Visit: Payer: Medicare Other | Admitting: Cardiology

## 2015-02-07 ENCOUNTER — Other Ambulatory Visit: Payer: Medicare Other

## 2015-03-11 ENCOUNTER — Other Ambulatory Visit: Payer: Self-pay | Admitting: Cardiology

## 2015-03-13 NOTE — Telephone Encounter (Signed)
Okay to refill? 

## 2015-03-27 ENCOUNTER — Other Ambulatory Visit: Payer: Self-pay | Admitting: Cardiology

## 2015-04-10 ENCOUNTER — Inpatient Hospital Stay (HOSPITAL_COMMUNITY)
Admission: EM | Admit: 2015-04-10 | Discharge: 2015-04-12 | DRG: 193 | Disposition: A | Discharge status: Home / Self Care | Payer: Medicare Other | Attending: Internal Medicine | Admitting: Internal Medicine

## 2015-04-10 ENCOUNTER — Emergency Department (HOSPITAL_COMMUNITY): Payer: Medicare Other

## 2015-04-10 ENCOUNTER — Encounter (HOSPITAL_COMMUNITY): Payer: Self-pay | Admitting: Vascular Surgery

## 2015-04-10 DIAGNOSIS — Z79899 Other long term (current) drug therapy: Secondary | ICD-10-CM

## 2015-04-10 DIAGNOSIS — I421 Obstructive hypertrophic cardiomyopathy: Secondary | ICD-10-CM | POA: Diagnosis present

## 2015-04-10 DIAGNOSIS — J9601 Acute respiratory failure with hypoxia: Secondary | ICD-10-CM | POA: Diagnosis present

## 2015-04-10 DIAGNOSIS — Z794 Long term (current) use of insulin: Secondary | ICD-10-CM

## 2015-04-10 DIAGNOSIS — R300 Dysuria: Secondary | ICD-10-CM

## 2015-04-10 DIAGNOSIS — E785 Hyperlipidemia, unspecified: Secondary | ICD-10-CM | POA: Diagnosis present

## 2015-04-10 DIAGNOSIS — R339 Retention of urine, unspecified: Secondary | ICD-10-CM | POA: Diagnosis present

## 2015-04-10 DIAGNOSIS — E1165 Type 2 diabetes mellitus with hyperglycemia: Secondary | ICD-10-CM | POA: Diagnosis present

## 2015-04-10 DIAGNOSIS — Z7902 Long term (current) use of antithrombotics/antiplatelets: Secondary | ICD-10-CM

## 2015-04-10 DIAGNOSIS — I2510 Atherosclerotic heart disease of native coronary artery without angina pectoris: Secondary | ICD-10-CM | POA: Diagnosis present

## 2015-04-10 DIAGNOSIS — Z955 Presence of coronary angioplasty implant and graft: Secondary | ICD-10-CM

## 2015-04-10 DIAGNOSIS — I10 Essential (primary) hypertension: Secondary | ICD-10-CM | POA: Diagnosis present

## 2015-04-10 DIAGNOSIS — J189 Pneumonia, unspecified organism: Secondary | ICD-10-CM | POA: Diagnosis present

## 2015-04-10 DIAGNOSIS — E7800 Pure hypercholesterolemia, unspecified: Secondary | ICD-10-CM | POA: Diagnosis present

## 2015-04-10 DIAGNOSIS — Z8673 Personal history of transient ischemic attack (TIA), and cerebral infarction without residual deficits: Secondary | ICD-10-CM

## 2015-04-10 DIAGNOSIS — I252 Old myocardial infarction: Secondary | ICD-10-CM

## 2015-04-10 DIAGNOSIS — N390 Urinary tract infection, site not specified: Secondary | ICD-10-CM | POA: Diagnosis present

## 2015-04-10 DIAGNOSIS — Z87891 Personal history of nicotine dependence: Secondary | ICD-10-CM | POA: Diagnosis not present

## 2015-04-10 DIAGNOSIS — R0902 Hypoxemia: Secondary | ICD-10-CM | POA: Diagnosis present

## 2015-04-10 DIAGNOSIS — R609 Edema, unspecified: Secondary | ICD-10-CM

## 2015-04-10 LAB — URINE MICROSCOPIC-ADD ON

## 2015-04-10 LAB — I-STAT CHEM 8, ED
BUN: 22 mg/dL — ABNORMAL HIGH (ref 6–20)
CHLORIDE: 99 mmol/L — AB (ref 101–111)
Calcium, Ion: 1.15 mmol/L (ref 1.13–1.30)
Creatinine, Ser: 1.6 mg/dL — ABNORMAL HIGH (ref 0.61–1.24)
Glucose, Bld: 189 mg/dL — ABNORMAL HIGH (ref 65–99)
HCT: 41 % (ref 39.0–52.0)
Hemoglobin: 13.9 g/dL (ref 13.0–17.0)
POTASSIUM: 4.7 mmol/L (ref 3.5–5.1)
SODIUM: 135 mmol/L (ref 135–145)
TCO2: 24 mmol/L (ref 0–100)

## 2015-04-10 LAB — CBC WITH DIFFERENTIAL/PLATELET
Basophils Absolute: 0 10*3/uL (ref 0.0–0.1)
Basophils Relative: 0 %
EOS PCT: 2 %
Eosinophils Absolute: 0.2 10*3/uL (ref 0.0–0.7)
HCT: 37.4 % — ABNORMAL LOW (ref 39.0–52.0)
Hemoglobin: 12.1 g/dL — ABNORMAL LOW (ref 13.0–17.0)
LYMPHS ABS: 2.5 10*3/uL (ref 0.7–4.0)
LYMPHS PCT: 21 %
MCH: 26.9 pg (ref 26.0–34.0)
MCHC: 32.4 g/dL (ref 30.0–36.0)
MCV: 83.3 fL (ref 78.0–100.0)
MONO ABS: 0.9 10*3/uL (ref 0.1–1.0)
Monocytes Relative: 8 %
Neutro Abs: 8.7 10*3/uL — ABNORMAL HIGH (ref 1.7–7.7)
Neutrophils Relative %: 69 %
PLATELETS: 357 10*3/uL (ref 150–400)
RBC: 4.49 MIL/uL (ref 4.22–5.81)
RDW: 17.2 % — ABNORMAL HIGH (ref 11.5–15.5)
WBC: 12.4 10*3/uL — ABNORMAL HIGH (ref 4.0–10.5)

## 2015-04-10 LAB — URINALYSIS, ROUTINE W REFLEX MICROSCOPIC
BILIRUBIN URINE: NEGATIVE
GLUCOSE, UA: NEGATIVE mg/dL
Ketones, ur: 15 mg/dL — AB
Nitrite: NEGATIVE
Protein, ur: >300 mg/dL — AB
SPECIFIC GRAVITY, URINE: 1.02 (ref 1.005–1.030)
UROBILINOGEN UA: 1 mg/dL (ref 0.0–1.0)
pH: 7 (ref 5.0–8.0)

## 2015-04-10 MED ORDER — ALBUTEROL SULFATE (2.5 MG/3ML) 0.083% IN NEBU
2.5000 mg | INHALATION_SOLUTION | Freq: Once | RESPIRATORY_TRACT | Status: AC
  Administered 2015-04-10: 2.5 mg via RESPIRATORY_TRACT
  Filled 2015-04-10: qty 3

## 2015-04-10 MED ORDER — DEXTROSE 5 % IV SOLN: 1.0000 g | Freq: Once | INTRAVENOUS | Status: AC

## 2015-04-10 MED ORDER — SODIUM CHLORIDE 0.9 % IV BOLUS (SEPSIS)
500.0000 mL | Freq: Once | INTRAVENOUS | Status: AC
  Administered 2015-04-10: 500 mL via INTRAVENOUS

## 2015-04-10 MED ORDER — DEXTROSE 5 % IV SOLN: 500.0000 mg | Freq: Once | INTRAVENOUS | Status: DC

## 2015-04-10 MED ORDER — DEXTROSE 5 % IV SOLN
500.0000 mg | Freq: Once | INTRAVENOUS | Status: AC
  Administered 2015-04-10: 500 mg via INTRAVENOUS
  Filled 2015-04-10: qty 500

## 2015-04-10 MED ORDER — AZITHROMYCIN 500 MG IV SOLR
500.0000 mg | INTRAVENOUS | Status: DC
  Administered 2015-04-12: 500 mg via INTRAVENOUS
  Filled 2015-04-10: qty 500

## 2015-04-10 MED ORDER — DEXTROSE 5 % IV SOLN
1.0000 g | Freq: Once | INTRAVENOUS | Status: AC
  Administered 2015-04-10: 1 g via INTRAVENOUS
  Filled 2015-04-10: qty 10

## 2015-04-10 MED ORDER — DEXTROSE 5 % IV SOLN
1.0000 g | INTRAVENOUS | Status: DC
  Administered 2015-04-11: 1 g via INTRAVENOUS
  Filled 2015-04-10 (×2): qty 10

## 2015-04-10 MED ORDER — PHENAZOPYRIDINE HCL 100 MG PO TABS
95.0000 mg | ORAL_TABLET | ORAL | Status: AC
  Administered 2015-04-10: 100 mg via ORAL
  Filled 2015-04-10: qty 1

## 2015-04-10 NOTE — ED Notes (Signed)
Bladder scanner showed approx. 50cc's of urine

## 2015-04-10 NOTE — Progress Notes (Signed)
ANTIBIOTIC CONSULT NOTE - INITIAL  Pharmacy Consult for Rocephin and Zithromax Indication: CAP  Allergies  Allergen Reactions  . Zyban [Bupropion Hcl]     unknown   Labs:  Recent Labs  04/10/15 2045 04/10/15 2051  WBC 12.4*  --   HGB 12.1* 13.9  PLT 357  --   CREATININE  --  1.60*    Medical History: Past Medical History  Diagnosis Date  . Hypertension   . Hyperlipidemia   . Diabetes mellitus     type 2  . Cerebrovascular accident [facility](HCC)     Old  . Hypertrophic obstructive cardiomyopathy     hystory of   . Peripheral edema   . Hypokalemia   . Acute inferolateral myocardial infarction (HCC) 12/23/99  . Coronary artery disease     cardiac catheterization in 2001 - Successful PTCA and stenting of the mid right coronary artery -- Well preserved left ventricular systolic function -- severe three-vessel coronary artery disease including a severe disease involving the right coronary artery     Assessment/Plan:  81yo male c/o urinary retention, became acutely SOB while in ED, CXR shows multilobar PNA, to begin IV ABX.  Will start Rocephin 1g IV Q24H and azithro 500mg  IV Q24H and monitor CBC, Cx.  Vernard GamblesVeronda Kirsten Spearing, PharmD, BCPS  04/10/2015,11:54 PM

## 2015-04-10 NOTE — ED Notes (Signed)
MD at bedside. 

## 2015-04-10 NOTE — ED Notes (Signed)
NP notified that pt is very anxious and has wheezing; NP at bedside

## 2015-04-10 NOTE — ED Notes (Signed)
NP at bedside.

## 2015-04-10 NOTE — ED Notes (Signed)
Pt reports to the ED for eval of urinary retention. He has only been able to urinate about 5-6 drops x 2 days. Denies any hx of constipation. Unsure if he has any prostate problems or not. States his urine was green yesterday and then it was brown. Denies any abd pain. Pt A&Ox4, resp e/u, and skin warm and dry.

## 2015-04-10 NOTE — ED Provider Notes (Signed)
CSN: [id number]     Arrival date & time 04/10/15  1531 History   First MD Initiated Contact with Patient 04/10/15 2023     Chief Complaint  Patient presents with  . Urinary Retention     (Consider location/radiation/quality/duration/timing/severity/associated sxs/prior Treatment) The history is provided by the patient.    Past Medical History  Diagnosis Date  . Hypertension   . Hyperlipidemia   . Diabetes mellitus     type 2  . Cerebrovascular accident [facility])     Old  . Hypertrophic obstructive cardiomyopathy     hystory of   . Peripheral edema   . Hypokalemia   . Acute inferolateral myocardial infarction (HCC) 12/23/99  . Coronary artery disease     cardiac catheterization in 2001 - Successful PTCA and stenting of the mid right coronary artery -- Well preserved left ventricular systolic function -- severe three-vessel coronary artery disease including a severe disease involving the right coronary artery    Past Surgical History  Procedure Laterality Date  . Cholecystectomy    . Colectomy    . Tonsillectomy    . Cardiac catheterization  12/23/99    Successful PTCA and stenting of the mid right coronary artery -- Well preserved left ventricular systolic function -- severe three-vessel coronary artery disease including a severe disease involving the right coronary artery    Family History  Problem Relation Age of Onset  . Arthritis Mother   . Heart disease Mother   . Hypertension Mother   . Diabetes Mother   . Heart disease Brother   . Diabetes Brother    Social History  Substance Use Topics  . Smoking status: Former Smoker -- 1.00 packs/day for 45 years    Types: Cigarettes    Quit date: 06/17/1993  . Smokeless tobacco: None  . Alcohol Use: No    Review of Systems  Constitutional: Negative for fever and chills.  Gastrointestinal: Positive for abdominal distention. Negative for abdominal pain.  All other systems reviewed and are negative.     Allergies   Zyban  Home Medications   Prior to Admission medications   Medication Sig Start Date End Date Taking? Authorizing Provider  amLODipine (NORVASC) 5 MG tablet TAKE 1 TABLET BY MOUTH EVERY DAY 03/27/15  Yes Cassell Clement, MD  clopidogrel (PLAVIX) 75 MG tablet TAKE 1 TABLET BY MOUTH EVERY DAY 03/27/15  Yes Cassell Clement, MD  fexofenadine (ALLEGRA) 60 MG tablet TAKE 1 TABLET BY MOUTH EVERY DAY 01/26/15  Yes Cassell Clement, MD  hydrochlorothiazide (MICROZIDE) 12.5 MG capsule TAKE 1 CAPSULE (12.5 MG TOTAL) BY MOUTH DAILY. 11/09/14  Yes Cassell Clement, MD  insulin NPH-regular Human (HUMULIN 70/30) (70-30) 100 UNIT/ML injection Inject 28 Units into the skin daily. 06/21/14 07/03/17 Yes Cassell Clement, MD  isosorbide mononitrate (IMDUR) 60 MG 24 hr tablet TAKE 1 TABLET BY MOUTH IN THE MORNING 10/13/14  Yes Cassell Clement, MD  LANTUS 100 UNIT/ML injection INJECT 10 UNITS SUBCUTANEOUSLY AT BEDTIME 03/14/15  Yes Cassell Clement, MD  lovastatin (MEVACOR) 10 MG tablet TAKE 1 TABLET BY MOUTH AT BEDTIME 01/25/15  Yes Cassell Clement, MD  metoprolol (LOPRESSOR) 50 MG tablet Take 50 mg by mouth every evening.   Yes Historical Provider, MD  nitroGLYCERIN (NITROSTAT) 0.4 MG SL tablet Place 1 tablet (0.4 mg total) under the tongue every 5 (five) minutes as needed. 10/08/13  Yes Cassell Clement, MD  pioglitazone (ACTOS) 30 MG tablet Take 30 mg by mouth daily.   Yes Historical Provider, MD  ramipril (ALTACE) 10 MG capsule TAKE 1 CAPSULE (10 MG TOTAL) BY MOUTH DAILY. 11/09/14  Yes Cassell Clement, MD  B-D INS SYRINGE 0.5CC/30GX1/2" 30G X 1/2" 0.5 ML MISC USE AS DIRECTED 07/21/14   Cassell Clement, MD  glucose blood test strip One Touch Ultra test strips testing 3 times a day and as needed. Diagnoses code 250.02 09/06/13   Cassell Clement, MD  metoprolol (LOPRESSOR) 100 MG tablet TAKE 1/2 TABLET BY MOUTH DAILY OR AS DIRECTED Patient not taking: Reported on 04/10/2015 11/10/14   Cassell Clement, MD   BP 149/63  mmHg  Pulse 129  Temp(Src) 97.6 F (36.4 C) (Oral)  Resp 23  SpO2 90% Physical Exam  Constitutional: He appears well-developed and well-nourished.  HENT:  Head: Normocephalic.  Eyes: Pupils are equal, round, and reactive to light.  Neck: Normal range of motion.  Cardiovascular: Normal rate and regular rhythm.   Pulmonary/Chest: Effort normal and breath sounds normal.  Abdominal: Soft. Bowel sounds are normal. He exhibits distension. There is no tenderness.  Musculoskeletal: Normal range of motion.  Neurological: He is alert.  Skin: Skin is warm.  Nursing note and vitals reviewed.   ED Course  Procedures (including critical care time) Labs Review Labs Reviewed  URINALYSIS, ROUTINE W REFLEX MICROSCOPIC (NOT AT [facility]) - Abnormal; Notable for the following:    Color, Urine AMBER (*)    APPearance TURBID (*)    Hgb urine dipstick LARGE (*)    Ketones, ur 15 (*)    Protein, ur >300 (*)    Leukocytes, UA LARGE (*)    All other components within normal limits  URINE MICROSCOPIC-ADD ON - Abnormal; Notable for the following:    Squamous Epithelial / LPF FEW (*)    Bacteria, UA FEW (*)    Casts HYALINE CASTS (*)    All other components within normal limits  CBC WITH DIFFERENTIAL/PLATELET - Abnormal; Notable for the following:    WBC 12.4 (*)    Hemoglobin 12.1 (*)    HCT 37.4 (*)    RDW 17.2 (*)    Neutro Abs 8.7 (*)    All other components within normal limits  I-STAT CHEM 8, ED - Abnormal; Notable for the following:    Chloride 99 (*)    BUN 22 (*)    Creatinine, Ser 1.60 (*)    Glucose, Bld 189 (*)    All other components within normal limits  URINE CULTURE    Imaging Review Dg Chest Portable 1 View  04/10/2015  CLINICAL DATA:  81 year old male with shortness breath and cough. EXAM: PORTABLE CHEST 1 VIEW COMPARISON:  Chest x-ray 07/14/2009. FINDINGS: Unusual opacity projecting over the upper right hemithorax, presumably tubing exterior to the patient. With this  limitation in mind, there is extensive patchy interstitial and airspace disease throughout the lungs bilaterally (right greater than left), concerning for bronchitis and multilobar pneumonia. This is most confluent throughout the entire right lung and in the left lung base. No definite pleural effusions. No evidence of pulmonary edema. Heart size is normal. Mediastinal contours are grossly distorted by patient's rotation to the right. Atherosclerosis in the thoracic aorta. IMPRESSION: 1. The appearance the chest suggests bronchitis and multilobar bronchopneumonia, as above. 2. Atherosclerosis. Electronically Signed   By: Trudie Reed M.D.   On: 04/10/2015 22:34   I have personally reviewed and evaluated these images and lab results as part of my medical decision-making.   EKG Interpretation None     will bladder scan check  kidney function  Patient only had 50 cc of urine in his bladder.  I've checked his kidney function and CBC both within normal parameters, but his urine does show that he's got many WBCs.  I have cultured the urine and started Rocephin and Pyridium for symptomatic relief and symptomatic UTI shortly after receiving the Rocephin and starting the IV fluids.  Patient became acutely short of breath and desatted to 87% on room air.  I have obtained a portable chest x-ray and stopped IV fluids. X-ray shows multilobar pneumonia.  Patient will be admitted to the hospital.  He will continue getting his Rocephin and added azithromycin for CAP increase his oxygen to 3 L by nasal cannula, which is holding his saturation at 90-91%. MDM   Final diagnoses:  CAP (community acquired pneumonia)  Dysuria         Earley FavorGail Columbia Pandey, NP 04/10/15 2329  Donnetta HutchingBrian Cook, MD 04/13/15 1409

## 2015-04-11 DIAGNOSIS — Z8673 Personal history of transient ischemic attack (TIA), and cerebral infarction without residual deficits: Secondary | ICD-10-CM

## 2015-04-11 DIAGNOSIS — I2510 Atherosclerotic heart disease of native coronary artery without angina pectoris: Secondary | ICD-10-CM | POA: Diagnosis present

## 2015-04-11 DIAGNOSIS — R339 Retention of urine, unspecified: Secondary | ICD-10-CM | POA: Diagnosis present

## 2015-04-11 DIAGNOSIS — J189 Pneumonia, unspecified organism: Secondary | ICD-10-CM | POA: Diagnosis present

## 2015-04-11 LAB — APTT: aPTT: 34 seconds (ref 24–37)

## 2015-04-11 LAB — COMPREHENSIVE METABOLIC PANEL
ALT: 10 U/L — AB (ref 17–63)
AST: 18 U/L (ref 15–41)
Albumin: 3.2 g/dL — ABNORMAL LOW (ref 3.5–5.0)
Alkaline Phosphatase: 50 U/L (ref 38–126)
Anion gap: 11 (ref 5–15)
BILIRUBIN TOTAL: 0.8 mg/dL (ref 0.3–1.2)
BUN: 16 mg/dL (ref 6–20)
CHLORIDE: 99 mmol/L — AB (ref 101–111)
CO2: 26 mmol/L (ref 22–32)
CREATININE: 1.52 mg/dL — AB (ref 0.61–1.24)
Calcium: 9.1 mg/dL (ref 8.9–10.3)
GFR calc Af Amer: 47 mL/min — ABNORMAL LOW (ref >60)
GFR, EST NON AFRICAN AMERICAN: 41 mL/min — AB (ref >60)
Glucose, Bld: 215 mg/dL — ABNORMAL HIGH (ref 65–99)
Potassium: 3.8 mmol/L (ref 3.5–5.1)
Sodium: 136 mmol/L (ref 135–145)
Total Protein: 6.6 g/dL (ref 6.5–8.1)

## 2015-04-11 LAB — PROCALCITONIN: Procalcitonin: <0.1 ng/mL

## 2015-04-11 LAB — CBC WITH DIFFERENTIAL/PLATELET
Basophils Absolute: 0 10*3/uL (ref 0.0–0.1)
Basophils Relative: 0 %
EOS PCT: 2 %
Eosinophils Absolute: 0.2 10*3/uL (ref 0.0–0.7)
HCT: 35 % — ABNORMAL LOW (ref 39.0–52.0)
Hemoglobin: 11.2 g/dL — ABNORMAL LOW (ref 13.0–17.0)
LYMPHS ABS: 1.7 10*3/uL (ref 0.7–4.0)
LYMPHS PCT: 17 %
MCH: 26.2 pg (ref 26.0–34.0)
MCHC: 32 g/dL (ref 30.0–36.0)
MCV: 82 fL (ref 78.0–100.0)
Monocytes Absolute: 0.9 10*3/uL (ref 0.1–1.0)
Monocytes Relative: 9 %
Neutro Abs: 7.3 10*3/uL (ref 1.7–7.7)
Neutrophils Relative %: 72 %
PLATELETS: 326 10*3/uL (ref 150–400)
RBC: 4.27 MIL/uL (ref 4.22–5.81)
RDW: 17.2 % — ABNORMAL HIGH (ref 11.5–15.5)
WBC: 10.3 10*3/uL (ref 4.0–10.5)

## 2015-04-11 LAB — STREP PNEUMONIAE URINARY ANTIGEN: Strep Pneumo Urinary Antigen: NEGATIVE

## 2015-04-11 LAB — GLUCOSE, CAPILLARY
GLUCOSE-CAPILLARY: 149 mg/dL — AB (ref 65–99)
Glucose-Capillary: 182 mg/dL — ABNORMAL HIGH (ref 65–99)
Glucose-Capillary: 116 mg/dL — ABNORMAL HIGH (ref 65–99)
Glucose-Capillary: 151 mg/dL — ABNORMAL HIGH (ref 65–99)
Glucose-Capillary: 134 mg/dL — ABNORMAL HIGH (ref 65–99)

## 2015-04-11 LAB — PROTIME-INR
INR: 1.2 (ref 0.00–1.49)
Prothrombin Time: 15.3 seconds — ABNORMAL HIGH (ref 11.6–15.2)

## 2015-04-11 LAB — LACTIC ACID, PLASMA
Lactic Acid, Venous: 1.6 mmol/L (ref 0.5–2.0)
Lactic Acid, Venous: 1.9 mmol/L (ref 0.5–2.0)

## 2015-04-11 LAB — BRAIN NATRIURETIC PEPTIDE: B Natriuretic Peptide: 447.4 pg/mL — ABNORMAL HIGH (ref 0.0–100.0)

## 2015-04-11 LAB — TROPONIN I: Troponin I: 0.03 ng/mL (ref <0.031)

## 2015-04-11 MED ORDER — PRAVASTATIN SODIUM 20 MG PO TABS
10.0000 mg | ORAL_TABLET | Freq: Every day | ORAL | Status: DC
  Administered 2015-04-11: 10 mg via ORAL
  Filled 2015-04-11: qty 1

## 2015-04-11 MED ORDER — TAMSULOSIN HCL 0.4 MG PO CAPS
0.4000 mg | ORAL_CAPSULE | Freq: Every day | ORAL | Status: DC
  Administered 2015-04-11: 0.4 mg via ORAL
  Filled 2015-04-11: qty 1

## 2015-04-11 MED ORDER — HEPARIN SODIUM (PORCINE) 5000 UNIT/ML IJ SOLN
5000.0000 [IU] | Freq: Three times a day (TID) | INTRAMUSCULAR | Status: DC
  Administered 2015-04-11 – 2015-04-12 (×4): 5000 [IU] via SUBCUTANEOUS
  Filled 2015-04-11 (×3): qty 1

## 2015-04-11 MED ORDER — CLOPIDOGREL BISULFATE 75 MG PO TABS
75.0000 mg | ORAL_TABLET | Freq: Every day | ORAL | Status: DC
Start: 2015-04-11 — End: 2015-04-12
  Administered 2015-04-11 – 2015-04-12 (×2): 75 mg via ORAL
  Filled 2015-04-11 (×2): qty 1

## 2015-04-11 MED ORDER — INSULIN ASPART 100 UNIT/ML ~~LOC~~ SOLN
0.0000 [IU] | Freq: Three times a day (TID) | SUBCUTANEOUS | Status: DC
  Administered 2015-04-11: 2 [IU] via SUBCUTANEOUS
  Administered 2015-04-11 – 2015-04-12 (×2): 3 [IU] via SUBCUTANEOUS
  Administered 2015-04-12: 2 [IU] via SUBCUTANEOUS

## 2015-04-11 MED ORDER — INSULIN ASPART 100 UNIT/ML ~~LOC~~ SOLN: 0.0000 [IU] | Freq: Every day | SUBCUTANEOUS | Status: DC

## 2015-04-11 MED ORDER — METOPROLOL TARTRATE 50 MG PO TABS
50.0000 mg | ORAL_TABLET | Freq: Every evening | ORAL | Status: DC
  Administered 2015-04-11: 50 mg via ORAL
  Filled 2015-04-11: qty 1

## 2015-04-11 MED ORDER — ISOSORBIDE MONONITRATE ER 60 MG PO TB24
60.0000 mg | ORAL_TABLET | Freq: Every morning | ORAL | Status: DC
  Administered 2015-04-11 – 2015-04-12 (×2): 60 mg via ORAL
  Filled 2015-04-11 (×2): qty 1

## 2015-04-11 NOTE — Evaluation (Signed)
Physical Therapy Evaluation Patient Details Name: Mathew [name] MRN: [id number] DOB: [birth year] Today's Date: 04/11/2015   History of Present Illness  [name] is a 81 y.o. male with Past medical history of hypertension, dyslipidemia, type 2 diabetes mellitus, CVA, HOCM, coronary artery disease.Admit with SOB.  Clinical Impression  Pt admitted with above diagnosis. Pt currently with functional limitations due to the deficits listed below (see PT Problem List). Pt should progress well and go home with family 24 hour assist and HHPT.  May benefit from 4 wheeled RW although pt states his new cane with 2 handles works great.   Pt will benefit from skilled PT to increase their independence and safety with mobility to allow discharge to the venue listed below.      Follow Up Recommendations Home health PT;Supervision/Assistance - 24 hour    Equipment Recommendations  Other (comment) (possibly a 4 wheeled RW)    Recommendations for Other Services       Precautions / Restrictions Precautions Precautions: Fall Restrictions Weight Bearing Restrictions: No      Mobility  Bed Mobility Overal bed mobility: Independent             General bed mobility comments: incr time  Transfers Overall transfer level: Needs assistance Equipment used: None Transfers: Sit to/from Stand Sit to Stand: Min guard         General transfer comment: incr time to get to standing and pt reaching for PT to hold onto  Ambulation/Gait Ambulation/Gait assistance: Min guard;Min assist Ambulation Distance (Feet): 200 Feet Assistive device: Rolling walker (2 wheeled) Gait Pattern/deviations: Step-through pattern;Decreased stride length;Leaning posteriorly;Drifts right/left;Wide base of support;Trunk flexed   Gait velocity interpretation: Below normal speed for age/gender General Gait Details: Overall pt ambulating well with RW.  Pt has a new cane that he says has two handles that works well  for him.  Pt definitely needs to use device and currently does not want to use RW.  Will continue to assess over next few days.  Pt safe with RW just occasional cues to stay close to it.    Stairs            Wheelchair Mobility    Modified Rankin (Stroke Patients Only)       Balance Overall balance assessment: Needs assistance         Standing balance support: Bilateral upper extremity supported;During functional activity Standing balance-Leahy Scale: Poor Standing balance comment: Needs UE support.                               Pertinent Vitals/Pain Pain Assessment: No/denies pain  VSS    Home Living Family/patient expects to be discharged to:: Private residence Living Arrangements: Spouse/significant other;Children (daughter comes in daytime in and out per pt,son lives with ) Available Help at Discharge: Family;Available PRN/intermittently;Available 24 hours/day (wife 24 hour S only, daughter intermittent,son can A too) Type of Home: House Home Access: Ramped entrance     Home Layout: Two level;Bed/bath upstairs Home Equipment: Cane - single point;Shower seat - built in;Grab bars - tub/shower;Hand held shower head (some type of new cane with flashlight)      Prior Function Level of Independence: Independent with assistive device(s)         Comments: used cane at times per pt     Hand Dominance   Dominant Hand: Right    Extremity/Trunk Assessment   Upper Extremity Assessment: Defer to  OT evaluation           Lower Extremity Assessment: Generalized weakness      Cervical / Trunk Assessment: Kyphotic  Communication   Communication: No difficulties  Cognition Arousal/Alertness: Awake/alert Behavior During Therapy: WFL for tasks assessed/performed Overall Cognitive Status: Within Functional Limits for tasks assessed                      General Comments      Exercises        Assessment/Plan    PT Assessment  Patient needs continued PT services  PT Diagnosis Generalized weakness   PT Problem List Decreased activity tolerance;Decreased balance;Decreased mobility;Decreased knowledge of use of DME;Decreased knowledge of precautions;Decreased safety awareness  PT Treatment Interventions DME instruction;Gait training;Functional mobility training;Therapeutic activities;Therapeutic exercise;Balance training;Patient/family education   PT Goals (Current goals can be found in the Care Plan section) Acute Rehab PT Goals Patient Stated Goal: to go home PT Goal Formulation: With patient Time For Goal Achievement: 04/25/15 Potential to Achieve Goals: Good    Frequency Min 3X/week   Barriers to discharge        Co-evaluation               End of Session Equipment Utilized During Treatment: Gait belt Activity Tolerance: Patient limited by fatigue Patient left: in bed;with call bell/phone within reach;with bed alarm set Nurse Communication: Mobility status         Time: 1610-9604 PT Time Calculation (min) (ACUTE ONLY): 17 min   Charges:   PT Evaluation $Initial PT Evaluation Tier I: 1 Procedure     PT G CodesBerline Lopes 17-Apr-2015, 12:21 PM Jhalil Silvera [facility] [phone] (pager)

## 2015-04-11 NOTE — Progress Notes (Signed)
Received pt report from Gabrielle,RN-ED. 

## 2015-04-11 NOTE — Progress Notes (Addendum)
  Patient admitted earlier this morning. H&P reviewed. Patient seen and examined.  S: Patient feels well. Denies any chest pain. Breathing is improved. Still has a cough. No nausea or vomiting. Once to go home.  O: Vital signs reviewed. He has been afebrile. Saturating 94% on room. Coarse breath sounds bilaterally with crackles especially in the left base. No wheezing, no rhonchi. S1, S2 is normal, regular. Systolic murmur appreciated over the aortic area. No S3, S4. Abdomen is soft, nontender, nondistended. Bowel sounds present. No masses, organomegaly  Lab data reviewed  A/P:  Agree with plan outlined in the H&P. Continue antibiotics for now. Has been able to urinate this morning. Consult PT and OT. Discussed with the patient's son Viviann SpareSteven at [phone].  Midori Dado 04/11/2015 11:49 AM

## 2015-04-11 NOTE — Progress Notes (Signed)
[name] [id number] Admission Data: 04/11/2015 1:58 AM Attending Provider: Rolly SalterPranav M Patel, MD WUJ:[id number]:Brackbill, Elijah Birkom, MD Code Status: Partial  Mathew [name] is a 81 y.o. male patient admitted from ED:  -No acute distress noted.  -No complaints of shortness of breath.  -No complaints of chest pain.   Cardiac Monitoring: Box # 32 in place. Cardiac monitor yields:atrial fibrillation, with ventricular rate of 72.  Blood pressure 151/56, pulse 90, temperature 97.3 F (36.3 C), temperature source Oral, resp. rate 20, height 5\' 9"  (1.753 m), weight 73.891 kg (162 lb 14.4 oz), SpO2 97 %.   IV Fluids:  IV in place, occlusive dsg intact without redness, IV cath antecubital right, condition patent and no redness none.   Allergies:  Zyban  Past Medical History:   has a past medical history of Hypertension; Hyperlipidemia; Diabetes mellitus; Cerebrovascular accident [facility](HCC); Hypertrophic obstructive cardiomyopathy; Peripheral edema; Hypokalemia; Acute inferolateral myocardial infarction [facility](HCC) (12/23/99); and Coronary artery disease.  Past Surgical History:   has past surgical history that includes Cholecystectomy; Colectomy; Tonsillectomy; and Cardiac catheterization (12/23/99).  Social History:   reports that he quit smoking about 21 years ago. His smoking use included Cigarettes. He has a 45 pack-year smoking history. He does not have any smokeless tobacco history on file. He reports that he does not drink alcohol or use illicit drugs.  Skin: NSI  Patient/Family orientated to room. Information packet given to patient/family. Admission inpatient armband information verified with patient/family to include name and date of birth and placed on patient arm. Side rails up x 2, fall assessment and education completed with patient/family. Patient/family able to verbalize understanding of risk associated with falls and verbalized understanding to call for assistance before getting out of bed. Call  light within reach. Patient/family able to voice and demonstrate understanding of unit orientation instructions.

## 2015-04-11 NOTE — Progress Notes (Signed)
Utilization review completed. Sherley Leser, RN, BSN. 

## 2015-04-11 NOTE — H&P (Signed)
[facility] History and Physical  Patient: [name]  MRN: [id number]  DOB: 05-18-1931  DOS: the patient was seen and examined on   04/11/2015 PCP: Ronny Flurry, MD  Referring physician: Earley Favor, NP Chief Complaint: Shortness of breath  HPI: [name] is a 81 y.o. male with Past medical history of hypertension, dyslipidemia, type 2 diabetes mellitus, CVA, HOCM, coronary artery disease. The patient is presenting with complaints of cough and shortness of breath ongoing since Friday. The patient does not have any phlegm production. Next outpatient 5 feverish as well as also had some chills. Patient on Saturday night was not able to urinate. Discontinued on Sunday as well. He decided to come to the hospital on Monday for further workup. The patient mentions that he initially had significant pain while he was in the ER restroom. Currently he does not have any pain while urinating. No chest pain and abdominal pain. No nausea no vomiting. He is feeling cold but no fever. He denies any dizziness or lightheadedness. He mentions his appetite was lower last couple of days. No recent hospitalization no recent procedures. Denies having any change in his medication.  The patient is coming from home At his baseline ambulates with cane And is independent for most of his ADL; manages his medication on his own.  Review of Systems: as mentioned in the history of present illness.  A comprehensive review of the other systems is negative.  Past Medical History  Diagnosis Date  . Hypertension   . Hyperlipidemia   . Diabetes mellitus     type 2  . Cerebrovascular accident [facility])     Old  . Hypertrophic obstructive cardiomyopathy     hystory of   . Peripheral edema   . Hypokalemia   . Acute inferolateral myocardial infarction (HCC) 12/23/99  . Coronary artery disease     cardiac catheterization in 2001 - Successful PTCA and stenting of the mid right coronary artery --  Well preserved left ventricular systolic function -- severe three-vessel coronary artery disease including a severe disease involving the right coronary artery    Past Surgical History  Procedure Laterality Date  . Cholecystectomy    . Colectomy    . Tonsillectomy    . Cardiac catheterization  12/23/99    Successful PTCA and stenting of the mid right coronary artery -- Well preserved left ventricular systolic function -- severe three-vessel coronary artery disease including a severe disease involving the right coronary artery    Social History:  reports that he quit smoking about 21 years ago. His smoking use included Cigarettes. He has a 45 pack-year smoking history. He does not have any smokeless tobacco history on file. He reports that he does not drink alcohol or use illicit drugs.  Allergies  Allergen Reactions  . Zyban [Bupropion Hcl]     unknown    Family History  Problem Relation Age of Onset  . Arthritis Mother   . Heart disease Mother   . Hypertension Mother   . Diabetes Mother   . Heart disease Brother   . Diabetes Brother     Prior to Admission medications   Medication Sig Start Date End Date Taking? Authorizing Provider  amLODipine (NORVASC) 5 MG tablet TAKE 1 TABLET BY MOUTH EVERY DAY 03/27/15  Yes Cassell Clement, MD  clopidogrel (PLAVIX) 75 MG tablet TAKE 1 TABLET BY MOUTH EVERY DAY 03/27/15  Yes Cassell Clement, MD  fexofenadine (ALLEGRA) 60 MG tablet TAKE 1 TABLET BY MOUTH  EVERY DAY 01/26/15  Yes Cassell Clementhomas Brackbill, MD  hydrochlorothiazide (MICROZIDE) 12.5 MG capsule TAKE 1 CAPSULE (12.5 MG TOTAL) BY MOUTH DAILY. 11/09/14  Yes Cassell Clementhomas Brackbill, MD  insulin NPH-regular Human (HUMULIN 70/30) (70-30) 100 UNIT/ML injection Inject 28 Units into the skin daily. 06/21/14 07/03/17 Yes Cassell Clementhomas Brackbill, MD  isosorbide mononitrate (IMDUR) 60 MG 24 hr tablet TAKE 1 TABLET BY MOUTH IN THE MORNING 10/13/14  Yes Cassell Clementhomas Brackbill, MD  LANTUS 100 UNIT/ML injection INJECT 10 UNITS  SUBCUTANEOUSLY AT BEDTIME 03/14/15  Yes Cassell Clementhomas Brackbill, MD  lovastatin (MEVACOR) 10 MG tablet TAKE 1 TABLET BY MOUTH AT BEDTIME 01/25/15  Yes Cassell Clementhomas Brackbill, MD  metoprolol (LOPRESSOR) 50 MG tablet Take 50 mg by mouth every evening.   Yes Historical Provider, MD  nitroGLYCERIN (NITROSTAT) 0.4 MG SL tablet Place 1 tablet (0.4 mg total) under the tongue every 5 (five) minutes as needed. 10/08/13  Yes Cassell Clementhomas Brackbill, MD  pioglitazone (ACTOS) 30 MG tablet Take 30 mg by mouth daily.   Yes Historical Provider, MD  ramipril (ALTACE) 10 MG capsule TAKE 1 CAPSULE (10 MG TOTAL) BY MOUTH DAILY. 11/09/14  Yes Cassell Clementhomas Brackbill, MD  B-D INS SYRINGE 0.5CC/30GX1/2" 30G X 1/2" 0.5 ML MISC USE AS DIRECTED 07/21/14   Cassell Clementhomas Brackbill, MD  glucose blood test strip One Touch Ultra test strips testing 3 times a day and as needed. Diagnoses code 250.02 09/06/13   Cassell Clementhomas Brackbill, MD  metoprolol (LOPRESSOR) 100 MG tablet TAKE 1/2 TABLET BY MOUTH DAILY OR AS DIRECTED Patient not taking: Reported on 04/10/2015 11/10/14   Cassell Clementhomas Brackbill, MD    Physical Exam: Filed Vitals:   04/10/15 2300 04/10/15 2330 04/11/15 0038 04/11/15 0527  BP: 149/63 154/58 151/56 115/53  Pulse: 129 73 90 42  Temp:   97.3 F (36.3 C) 97.9 F (36.6 C)  TempSrc:   Oral Oral  Resp: 23 24 20 19   Height:   5\' 9"  (1.753 m)   Weight:   73.891 kg (162 lb 14.4 oz)   SpO2: 90% 94% 97% 96%    General: Alert, Awake and Oriented to Time, Place and Person. Appear in mild distress Eyes: PERRL ENT: Oral Mucosa clear moist. Neck: no JVD Cardiovascular: S1 and S2 Present, aortic systolic Murmur, Peripheral Pulses Present Respiratory: Bilateral Air entry equal and Decreased,  Bilateral basal Crackles, no wheezes Abdomen: Bowel Sound present, Soft and no tenderness Skin: no Rash Extremities: no Pedal edema, no calf tenderness Neurologic: Grossly no focal neuro deficit.  Labs on Admission:  CBC:  Recent Labs Lab 04/10/15 2045 04/10/15 2051    WBC 12.4*  --   NEUTROABS 8.7*  --   HGB 12.1* 13.9  HCT 37.4* 41.0  MCV 83.3  --   PLT 357  --     CMP     Component Value Date/Time   NA 136 04/11/2015 0120   K 3.8 04/11/2015 0120   CL 99* 04/11/2015 0120   CO2 26 04/11/2015 0120   GLUCOSE 215* 04/11/2015 0120   BUN 16 04/11/2015 0120   CREATININE 1.52* 04/11/2015 0120   CALCIUM 9.1 04/11/2015 0120   PROT 6.6 04/11/2015 0120   ALBUMIN 3.2* 04/11/2015 0120   AST 18 04/11/2015 0120   ALT 10* 04/11/2015 0120   ALKPHOS 50 04/11/2015 0120   BILITOT 0.8 04/11/2015 0120   GFRNONAA 41* 04/11/2015 0120   GFRAA 47* 04/11/2015 0120     Recent Labs Lab 04/11/15 0120  TROPONINI 0.03   BNP (last 3 results)  Recent Labs  04/11/15 0120  BNP 447.4*    ProBNP (last 3 results) No results for input(s): PROBNP in the last 8760 hours.   Radiological Exams on Admission: Dg Chest Portable 1 View  04/10/2015  CLINICAL DATA:  81 year old male with shortness breath and cough. EXAM: PORTABLE CHEST 1 VIEW COMPARISON:  Chest x-ray 07/14/2009. FINDINGS: Unusual opacity projecting over the upper right hemithorax, presumably tubing exterior to the patient. With this limitation in mind, there is extensive patchy interstitial and airspace disease throughout the lungs bilaterally (right greater than left), concerning for bronchitis and multilobar pneumonia. This is most confluent throughout the entire right lung and in the left lung base. No definite pleural effusions. No evidence of pulmonary edema. Heart size is normal. Mediastinal contours are grossly distorted by patient's rotation to the right. Atherosclerosis in the thoracic aorta. IMPRESSION: 1. The appearance the chest suggests bronchitis and multilobar bronchopneumonia, as above. 2. Atherosclerosis. Electronically Signed   By: Trudie Reed M.D.   On: 04/10/2015 22:34   EKG: Independently reviewed. nonspecific ST and T waves changes, sinus tachycardia.  Assessment/Plan 1. CAP  (community acquired pneumonia) Patient presents with complaints of cough and shortness of breath. Patient also had some urinary retention and his symptom. Patient does not have any significant amount of urine in his bladder. Does not appear to be clinically dehydrated. Although the patient is found to be having multilobar pneumonia with hypoxia. With this the patient will be admitted in the hospital. Patient will be given ceftriaxone and azithromycin for community-acquired coverage follow the cultures and antigens.  2.  HOCM (hypertrophic obstructive cardiomyopathy) (HCC) With chronic diastolic dysfunction. We'll continue close monitoring of patient's urine output as well as a serum BMP. Avoid aggressive hydration and avoid tachycardia.  3  Cerebrovascular accident, old Continuing Plavix. Continuing statin.  4 type 2 diabetes mellitus. Next and holding oral hypoglycemic agent as well as insulin. Placing him on sliding scale insulin currently.  5 urinary retention. The patient initially presented with urinary retention although he does not have any significant urinary bladder scan. Renal function appears to be stable as well. The patient was given ceftriaxone initially thinking UTI. Currently I would continue ceftriaxone. Monitor ins and outs.  Nutrition: Regular diet DVT Prophylaxis: subcutaneous Heparin  Advance goals of care discussion: Partial, patient does not want to be intubated but wants to be resuscitated   Disposition: Admitted as inpatient, telemetryunit.  Author: Lynden Oxford, MD [facility] Pager: [phone] 04/11/2015  If 7PM-7AM, please contact night-coverage [email or web address] Password [email or web address]

## 2015-04-12 ENCOUNTER — Ambulatory Visit (HOSPITAL_COMMUNITY): Payer: Medicare Other

## 2015-04-12 DIAGNOSIS — R0902 Hypoxemia: Secondary | ICD-10-CM

## 2015-04-12 DIAGNOSIS — J189 Pneumonia, unspecified organism: Principal | ICD-10-CM

## 2015-04-12 DIAGNOSIS — I2510 Atherosclerotic heart disease of native coronary artery without angina pectoris: Secondary | ICD-10-CM

## 2015-04-12 DIAGNOSIS — I421 Obstructive hypertrophic cardiomyopathy: Secondary | ICD-10-CM

## 2015-04-12 DIAGNOSIS — E7800 Pure hypercholesterolemia, unspecified: Secondary | ICD-10-CM

## 2015-04-12 DIAGNOSIS — R339 Retention of urine, unspecified: Secondary | ICD-10-CM

## 2015-04-12 LAB — CBC
HEMATOCRIT: 35 % — AB (ref 39.0–52.0)
Hemoglobin: 10.7 g/dL — ABNORMAL LOW (ref 13.0–17.0)
MCH: 25.7 pg — ABNORMAL LOW (ref 26.0–34.0)
MCHC: 30.6 g/dL (ref 30.0–36.0)
MCV: 84.1 fL (ref 78.0–100.0)
PLATELETS: 331 10*3/uL (ref 150–400)
RBC: 4.16 MIL/uL — AB (ref 4.22–5.81)
RDW: 17.3 % — AB (ref 11.5–15.5)
WBC: 10.5 10*3/uL (ref 4.0–10.5)

## 2015-04-12 LAB — LEGIONELLA PNEUMOPHILA SEROGP 1 UR AG: L. pneumophila Serogp 1 Ur Ag: NEGATIVE

## 2015-04-12 LAB — GLUCOSE, CAPILLARY
Glucose-Capillary: 127 mg/dL — ABNORMAL HIGH (ref 65–99)
Glucose-Capillary: 175 mg/dL — ABNORMAL HIGH (ref 65–99)

## 2015-04-12 LAB — BASIC METABOLIC PANEL
ANION GAP: 13 (ref 5–15)
BUN: 17 mg/dL (ref 6–20)
CALCIUM: 9.1 mg/dL (ref 8.9–10.3)
CO2: 24 mmol/L (ref 22–32)
Chloride: 98 mmol/L — ABNORMAL LOW (ref 101–111)
Creatinine, Ser: 1.4 mg/dL — ABNORMAL HIGH (ref 0.61–1.24)
GFR, EST AFRICAN AMERICAN: 52 mL/min — AB (ref >60)
GFR, EST NON AFRICAN AMERICAN: 45 mL/min — AB (ref >60)
Glucose, Bld: 171 mg/dL — ABNORMAL HIGH (ref 65–99)
POTASSIUM: 4.1 mmol/L (ref 3.5–5.1)
Sodium: 135 mmol/L (ref 135–145)

## 2015-04-12 LAB — URINE CULTURE: Culture: NO GROWTH

## 2015-04-12 MED ORDER — AZITHROMYCIN 500 MG PO TABS
500.0000 mg | ORAL_TABLET | Freq: Every day | ORAL | Status: DC
  Administered 2015-04-12: 500 mg via ORAL
  Filled 2015-04-12: qty 1

## 2015-04-12 MED ORDER — GUAIFENESIN ER 600 MG PO TB12: 1200.0000 mg | ORAL_TABLET | Freq: Two times a day (BID) | ORAL | Status: DC

## 2015-04-12 MED ORDER — AMOXICILLIN-POT CLAVULANATE 875-125 MG PO TABS: 1.0000 | ORAL_TABLET | Freq: Two times a day (BID) | ORAL | Status: DC

## 2015-04-12 NOTE — Discharge Summary (Signed)
Physician Discharge Summary  Mathew LeftDonnie C Swamy MVH:[id number] DOB: February 13, 1931 DOA: 04/10/2015  PCP: Ronny FlurryBrackbill, Tom, MD  Admit date: 04/10/2015 Discharge date: 04/12/2015  Time spent: 40 minutes  Recommendations for Outpatient Follow-up:  1. Follow-up with primary care physician within one week.  Discharge Diagnoses:  Principal Problem:   CAP (community acquired pneumonia) Active Problems:   HOCM (hypertrophic obstructive cardiomyopathy) (HCC)   Cerebrovascular accident, old   Hypercholesterolemia   Hypoxia   CAD (coronary artery disease)   Urinary retention   Discharge Condition: Stable  Diet recommendation: Heart healthy  Filed Weights   04/11/15 0038 04/12/15 0519  Weight: 73.891 kg (162 lb 14.4 oz) 80.5 kg (177 lb 7.5 oz)    History of present illness:  [name] is a 81 y.o. male with Past medical history of hypertension, dyslipidemia, type 2 diabetes mellitus, CVA, HOCM, coronary artery disease. The patient is presenting with complaints of cough and shortness of breath ongoing since Friday. The patient does not have any phlegm production. Next outpatient 5 feverish as well as also had some chills. Patient on Saturday night was not able to urinate. Discontinued on Sunday as well. He decided to come to the hospital on Monday for further workup. The patient mentions that he initially had significant pain while he was in the ER restroom. Currently he does not have any pain while urinating. No chest pain and abdominal pain. No nausea no vomiting. He is feeling cold but no fever. He denies any dizziness or lightheadedness. He mentions his appetite was lower last couple of days. No recent hospitalization no recent procedures. Denies having any change in his medication.  The patient is coming from home At his baseline ambulates with cane And is independent for most of his ADL; manages his medication on his own.  Hospital Course:   CAP (community acquired  pneumonia) Patient presents with complaints of cough and shortness of breath. Patient also had some urinary retention and his symptom. Patient does not have any significant amount of urine in his bladder. Does not appear to be clinically dehydrated. Although the patient is found to be having multilobar pneumonia with hypoxia. Treated with Rocephin and azithromycin, on discharge Augmentin for 5 more days with Mucinex. I thought she will benefit from one more day of IV antibiotics, patient adamant about going home. I spoke with his son today, he is okay with him going home.  Acute hypoxic respiratory failure Patient resented with oxygen saturation of 87% on room air, had to be on 2 L of oxygen. After patient is started on antibiotics and Mucinex his oxygen saturation improved and he is on room air now. Patient ambulated without any hypoxia or need for supplemental oxygen.  HOCM (hypertrophic obstructive cardiomyopathy) (HCC) With chronic diastolic dysfunction. We'll continue close monitoring of patient's urine output as well as a serum BMP. Avoid aggressive hydration and avoid tachycardia.  Cerebrovascular accident, old Continuing Plavix. Continuing statin.  Type 2 diabetes mellitus. Next and holding oral hypoglycemic agent as well as insulin. Placing him on sliding scale insulin currently.  Urinary retention. The patient initially presented with urinary retention although he does not have any significant urinary bladder scan. Per nursing staff patient does not have any hypertension he is able to be on his own right now. Urinalysis showed possible UTI, urine culture was negative, anyway patient is on Augmentin for 5 more days.  Procedures:  None  Consultations:  None  Discharge Exam: Filed Vitals:   04/12/15 1024  BP:  142/68  Pulse: 86  Temp: 97.4 F (36.3 C)  Resp: 20   General: Alert and awake, oriented x3, not in any acute distress. HEENT: anicteric sclera, pupils  reactive to light and accommodation, EOMI CVS: S1-S2 clear, no murmur rubs or gallops Chest: clear to auscultation bilaterally, no wheezing, rales or rhonchi Abdomen: soft nontender, nondistended, normal bowel sounds, no organomegaly Extremities: no cyanosis, clubbing or edema noted bilaterally Neuro: Cranial nerves II-XII intact, no focal neurological deficits  Discharge Instructions   Discharge Instructions    Diet - low sodium heart healthy    Complete by:  As directed      Increase activity slowly    Complete by:  As directed           Current Discharge Medication List    START taking these medications   Details  amoxicillin-clavulanate (AUGMENTIN) 875-125 MG tablet Take 1 tablet by mouth 2 (two) times daily. Qty: 10 tablet, Refills: 0    guaiFENesin (MUCINEX) 600 MG 12 hr tablet Take 2 tablets (1,200 mg total) by mouth 2 (two) times daily. Qty: 30 tablet, Refills: 30      CONTINUE these medications which have NOT CHANGED   Details  amLODipine (NORVASC) 5 MG tablet TAKE 1 TABLET BY MOUTH EVERY DAY Qty: 90 tablet, Refills: 0    clopidogrel (PLAVIX) 75 MG tablet TAKE 1 TABLET BY MOUTH EVERY DAY Qty: 90 tablet, Refills: 0    fexofenadine (ALLEGRA) 60 MG tablet TAKE 1 TABLET BY MOUTH EVERY DAY Qty: 90 tablet, Refills: 0   Associated Diagnoses: Nasal sinus congestion    hydrochlorothiazide (MICROZIDE) 12.5 MG capsule TAKE 1 CAPSULE (12.5 MG TOTAL) BY MOUTH DAILY. Qty: 90 capsule, Refills: 1    insulin NPH-regular Human (HUMULIN 70/30) (70-30) 100 UNIT/ML injection Inject 28 Units into the skin daily. Qty: 10 mL, Refills: 12    isosorbide mononitrate (IMDUR) 60 MG 24 hr tablet TAKE 1 TABLET BY MOUTH IN THE MORNING Qty: 90 tablet, Refills: 1    LANTUS 100 UNIT/ML injection INJECT 10 UNITS SUBCUTANEOUSLY AT BEDTIME Qty: 10 mL, Refills: 11    lovastatin (MEVACOR) 10 MG tablet TAKE 1 TABLET BY MOUTH AT BEDTIME Qty: 90 tablet, Refills: 0    !! metoprolol (LOPRESSOR)  50 MG tablet Take 50 mg by mouth every evening.    nitroGLYCERIN (NITROSTAT) 0.4 MG SL tablet Place 1 tablet (0.4 mg total) under the tongue every 5 (five) minutes as needed. Qty: 25 tablet, Refills: prn   Associated Diagnoses: Ischemic heart disease    pioglitazone (ACTOS) 30 MG tablet Take 30 mg by mouth daily.    ramipril (ALTACE) 10 MG capsule TAKE 1 CAPSULE (10 MG TOTAL) BY MOUTH DAILY. Qty: 90 capsule, Refills: 1    B-D INS SYRINGE 0.5CC/30GX1/2" 30G X 1/2" 0.5 ML MISC USE AS DIRECTED Qty: 100 each, Refills: prn    glucose blood test strip One Touch Ultra test strips testing 3 times a day and as needed. Diagnoses code 250.02 Qty: 300 each, Refills: 12   Associated Diagnoses: Type II or unspecified type diabetes mellitus without mention of complication, uncontrolled    !! metoprolol (LOPRESSOR) 100 MG tablet TAKE 1/2 TABLET BY MOUTH DAILY OR AS DIRECTED Qty: 45 tablet, Refills: 1     !! - Potential duplicate medications found. Please discuss with provider.     Allergies  Allergen Reactions  . Zyban [Bupropion Hcl]     unknown   Follow-up Information    Follow up with Brackbill,  Elijah Birk, MD In 1 week.   Specialty:  Cardiology   Contact information:   [address] [place] [address] [phone]        The results of significant diagnostics from this hospitalization (including imaging, microbiology, ancillary and laboratory) are listed below for reference.    Significant Diagnostic Studies: Dg Chest Portable 1 View  04/10/2015  CLINICAL DATA:  81 year old male with shortness breath and cough. EXAM: PORTABLE CHEST 1 VIEW COMPARISON:  Chest x-ray 07/14/2009. FINDINGS: Unusual opacity projecting over the upper right hemithorax, presumably tubing exterior to the patient. With this limitation in mind, there is extensive patchy interstitial and airspace disease throughout the lungs bilaterally (right greater than left), concerning for bronchitis and multilobar  pneumonia. This is most confluent throughout the entire right lung and in the left lung base. No definite pleural effusions. No evidence of pulmonary edema. Heart size is normal. Mediastinal contours are grossly distorted by patient's rotation to the right. Atherosclerosis in the thoracic aorta. IMPRESSION: 1. The appearance the chest suggests bronchitis and multilobar bronchopneumonia, as above. 2. Atherosclerosis. Electronically Signed   By: Trudie Reed M.D.   On: 04/10/2015 22:34    Microbiology: Recent Results (from the past 240 hour(s))  Urine culture     Status: None   Collection Time: 04/10/15  4:17 PM  Result Value Ref Range Status   Specimen Description URINE, RANDOM  Final   Special Requests ADDED 0214 04/11/15  Final   Culture NO GROWTH 1 DAY  Final   Report Status 04/12/2015 FINAL  Final     Labs: Basic Metabolic Panel:  Recent Labs Lab 04/10/15 2051 04/11/15 0120 04/12/15 0215  NA 135 136 135  K 4.7 3.8 4.1  CL 99* 99* 98*  CO2  --  26 24  GLUCOSE 189* 215* 171*  BUN 22* 16 17  CREATININE 1.60* 1.52* 1.40*  CALCIUM  --  9.1 9.1   Liver Function Tests:  Recent Labs Lab 04/11/15 0120  AST 18  ALT 10*  ALKPHOS 50  BILITOT 0.8  PROT 6.6  ALBUMIN 3.2*   No results for input(s): LIPASE, AMYLASE in the last 168 hours. No results for input(s): AMMONIA in the last 168 hours. CBC:  Recent Labs Lab 04/10/15 2045 04/10/15 2051 04/11/15 0721 04/12/15 0215  WBC 12.4*  --  10.3 10.5  NEUTROABS 8.7*  --  7.3  --   HGB 12.1* 13.9 11.2* 10.7*  HCT 37.4* 41.0 35.0* 35.0*  MCV 83.3  --  82.0 84.1  PLT 357  --  326 331   Cardiac Enzymes:  Recent Labs Lab 04/11/15 0120  TROPONINI 0.03   BNP: BNP (last 3 results)  Recent Labs  04/11/15 0120  BNP 447.4*    ProBNP (last 3 results) No results for input(s): PROBNP in the last 8760 hours.  CBG:  Recent Labs Lab 04/11/15 0620 04/11/15 1135 04/11/15 1730 04/11/15 2249 04/12/15 0628  GLUCAP  116* 151* 134* 149* 127*       Signed:  Daviana Haymaker A  [facility] 04/12/2015, 10:54 AM

## 2015-04-12 NOTE — Progress Notes (Signed)
Patient is discharge to home at 12:37 pm  accompanied by spouse and Chief Operating Officervolunteer personnel via wheelchair. Discharge instructions given. Patient and son verbalizes understanding. All personal belongings given. Denies any pain. No signs and symptoms of distress noted.

## 2015-04-12 NOTE — Care Management Note (Signed)
Case Management Note  Patient Details  Name: [name] MRN: [id number] Date of Birth: Jul 26, 1930  Subjective/Objective:    Admitted with Pneumonia                Action/Plan: Patient lives at home with spouse and son. Patient use a cane and walker at home. [facility], patient stated that he has a nurse and physical therapist come out to his home but does not recall the Agency. Patient gave me permission to call his spouse [name]. TCT spouse - Ms [name] stated [facility] was for her and she does not want any HHC for her husband. Patient is in agreement and does not want any HHC services at this time.   Expected Discharge Date:    04/12/2015              Expected Discharge Plan:  Home w Home Health Services  Discharge planning Services  CM Consult Choice offered to:  Spouse  [facility] Ar[facility]anged:  Patient Refused  Status of Service:  Completed, signed off   Reola MosherChandler, Ronna Herskowitz L, RN,MHA,BSN [phone] 04/12/2015, 10:49 AM

## 2015-04-16 LAB — CULTURE, BLOOD (ROUTINE X 2)
Culture: NO GROWTH
Culture: NO GROWTH

## 2015-04-18 ENCOUNTER — Other Ambulatory Visit: Payer: Self-pay | Admitting: Cardiology

## 2015-04-19 NOTE — Telephone Encounter (Signed)
Pt requesting refill on test strips .  Please advise ?

## 2015-04-19 NOTE — Telephone Encounter (Signed)
OK to refill One Touch ultra strips

## 2015-04-24 ENCOUNTER — Ambulatory Visit (INDEPENDENT_AMBULATORY_CARE_PROVIDER_SITE_OTHER): Payer: Medicare Other | Admitting: Cardiology

## 2015-04-24 ENCOUNTER — Encounter: Payer: Self-pay | Admitting: Cardiology

## 2015-04-24 VITALS — BP 124/70 | HR 57 | Ht 68.0 in | Wt 163.8 lb

## 2015-04-24 DIAGNOSIS — I422 Other hypertrophic cardiomyopathy: Secondary | ICD-10-CM

## 2015-04-24 DIAGNOSIS — E7800 Pure hypercholesterolemia, unspecified: Secondary | ICD-10-CM

## 2015-04-24 DIAGNOSIS — I119 Hypertensive heart disease without heart failure: Secondary | ICD-10-CM

## 2015-04-24 DIAGNOSIS — E0859 Diabetes mellitus due to underlying condition with other circulatory complications: Secondary | ICD-10-CM

## 2015-04-24 LAB — LIPID PANEL
CHOL/HDL RATIO: 3.3 ratio (ref <=5.0)
Cholesterol: 123 mg/dL — ABNORMAL LOW (ref 125–200)
HDL: 37 mg/dL — AB (ref >=40)
LDL CALC: 69 mg/dL (ref <130)
Triglycerides: 84 mg/dL (ref <150)
VLDL: 17 mg/dL (ref <30)

## 2015-04-24 LAB — HEPATIC FUNCTION PANEL
ALBUMIN: 3.8 g/dL (ref 3.6–5.1)
ALT: 13 U/L (ref 9–46)
AST: 18 U/L (ref 10–35)
Alkaline Phosphatase: 57 U/L (ref 40–115)
BILIRUBIN TOTAL: 0.6 mg/dL (ref 0.2–1.2)
Bilirubin, Direct: 0.2 mg/dL (ref <=0.2)
Indirect Bilirubin: 0.4 mg/dL (ref 0.2–1.2)
TOTAL PROTEIN: 7.6 g/dL (ref 6.1–8.1)

## 2015-04-24 LAB — BASIC METABOLIC PANEL
BUN: 19 mg/dL (ref 7–25)
CHLORIDE: 105 mmol/L (ref 98–110)
CO2: 25 mmol/L (ref 20–31)
Calcium: 9.6 mg/dL (ref 8.6–10.3)
Creat: 1.31 mg/dL — ABNORMAL HIGH (ref 0.70–1.11)
Glucose, Bld: 51 mg/dL — ABNORMAL LOW (ref 65–99)
Potassium: 4.4 mmol/L (ref 3.5–5.3)
SODIUM: 142 mmol/L (ref 135–146)

## 2015-04-24 LAB — HEMOGLOBIN A1C
Hgb A1c MFr Bld: 6.3 % — ABNORMAL HIGH (ref <5.7)
MEAN PLASMA GLUCOSE: 134 mg/dL — AB (ref <117)

## 2015-04-24 NOTE — Progress Notes (Signed)
Cardiology Office Note   Date:  04/24/2015   ID:  [name], DOB 1931/02/22, MRN [id number]  PCP:  Ronny Flurry, MD  Cardiologist: Cassell Clement MD  Chief Complaint  Patient presents with  . Coronary Artery Disease      History of Present Illness: [name] is a 81 y.o. male who presents for follow-up office visit   He has a history of ischemic heart disease and a history of hypertrophic cardiomyopathy. He had a stent to his right coronary artery in 2001. He has a prior history of a stroke. He has a history of high blood pressure. He is an insulin-dependent diabetic and has hypercholesterolemia. Since last visit he's been doing fair. He has occasional symptoms of indigestion. He states it is mild and and he does not need any treatment for it and it goes away within 10 minutes. The patient has not had any recurrent TIA symptoms. He has not been aware of any palpitations or racing of his heart. He denies chest pain dizziness or syncope. He has mild chronic exertional dyspnea. The patient was hospitalized from 04/10/15 until 04/12/15 with community-acquired pneumonia.  He also was treated for a urinary tract infection.  His cardiac medications were left unchanged.  He has lost 10 pounds since last visit.  He denies any chest pain or increased shortness of breath.  He has not coughing up any sputum.  He is voiding better.  He has poor balance secondary to prior strokes.  Past Medical History  Diagnosis Date  . Hypertension   . Hyperlipidemia   . Diabetes mellitus     type 2  . Cerebrovascular accident [facility])     Old  . Hypertrophic obstructive cardiomyopathy     hystory of   . Peripheral edema   . Hypokalemia   . Acute inferolateral myocardial infarction (HCC) 12/23/99  . Coronary artery disease     cardiac catheterization in 2001 - Successful PTCA and stenting of the mid right coronary artery -- Well preserved left ventricular systolic function -- severe  three-vessel coronary artery disease including a severe disease involving the right coronary artery     Past Surgical History  Procedure Laterality Date  . Cholecystectomy    . Colectomy    . Tonsillectomy    . Cardiac catheterization  12/23/99    Successful PTCA and stenting of the mid right coronary artery -- Well preserved left ventricular systolic function -- severe three-vessel coronary artery disease including a severe disease involving the right coronary artery      Current Outpatient Prescriptions  Medication Sig Dispense Refill  . amLODipine (NORVASC) 5 MG tablet TAKE 1 TABLET BY MOUTH EVERY DAY 90 tablet 0  . B-D INS SYRINGE 0.5CC/30GX1/2" 30G X 1/2" 0.5 ML MISC USE AS DIRECTED 100 each prn  . clopidogrel (PLAVIX) 75 MG tablet TAKE 1 TABLET BY MOUTH EVERY DAY 90 tablet 0  . fexofenadine (ALLEGRA) 60 MG tablet TAKE 1 TABLET BY MOUTH EVERY DAY 90 tablet 0  . hydrochlorothiazide (MICROZIDE) 12.5 MG capsule TAKE 1 CAPSULE (12.5 MG TOTAL) BY MOUTH DAILY. 90 capsule 1  . insulin NPH-regular Human (HUMULIN 70/30) (70-30) 100 UNIT/ML injection Inject 28 Units into the skin daily. 10 mL 12  . isosorbide mononitrate (IMDUR) 60 MG 24 hr tablet TAKE 1 TABLET BY MOUTH IN THE MORNING 90 tablet 1  . LANTUS 100 UNIT/ML injection INJECT 10 UNITS SUBCUTANEOUSLY AT BEDTIME 10 mL 11  . lovastatin (MEVACOR) 10 MG  tablet TAKE 1 TABLET BY MOUTH AT BEDTIME 90 tablet 0  . metoprolol (LOPRESSOR) 100 MG tablet TAKE 1/2 TABLET BY MOUTH DAILY OR AS DIRECTED 45 tablet 1  . metoprolol (LOPRESSOR) 50 MG tablet Take 50 mg by mouth every evening.    . nitroGLYCERIN (NITROSTAT) 0.4 MG SL tablet Place 1 tablet (0.4 mg total) under the tongue every 5 (five) minutes as needed. 25 tablet prn  . ONE TOUCH ULTRA TEST test strip ONE TOUCH ULTRA TEST STRIPS TESTING 3 TIMES A DAY AND AS NEEDED. DIAGNOSES CODE 250.02 100 each prn  . pioglitazone (ACTOS) 30 MG tablet Take 30 mg by mouth daily.    . ramipril (ALTACE) 10 MG  capsule TAKE 1 CAPSULE (10 MG TOTAL) BY MOUTH DAILY. 90 capsule 1   No current facility-administered medications for this visit.    Allergies:   Zyban    Social History:  The patient  reports that he quit smoking about 21 years ago. His smoking use included Cigarettes. He has a 45 pack-year smoking history. He does not have any smokeless tobacco history on file. He reports that he does not drink alcohol or use illicit drugs.   Family History:  The patient's family history includes Arthritis in his mother; Diabetes in his brother and mother; Heart disease in his brother and mother; Hypertension in his mother.    ROS:  Please see the history of present illness.   Otherwise, review of systems are positive for none.   All other systems are reviewed and negative.    PHYSICAL EXAM: VS:  BP 124/70 mmHg  Pulse 57  Ht  (1.727 m)  Wt 163 lb 12.8 oz (74.299 kg)  BMI 24.91 kg/m2 , BMI Body mass index is 24.91 kg/(m^2). GEN: Well nourished, well developed, in no acute distress HEENT: normal Neck: no JVD, carotid bruits, or masses Cardiac: RRR; there is a grade 2/6 systolic ejection murmur at the aortic area.  There is no diastolic murmur.  There is no, rubs, or gallops,no edema  Respiratory:  clear to auscultation bilaterally, normal work of breathing GI: soft, nontender, nondistended, + BS MS: no deformity or atrophy Skin: warm and dry, no rash Neuro:  Strength and sensation are intact Psych: euthymic mood, full affect   EKG:  EKG is ordered today. The ekg ordered today demonstrates sinus bradycardia with PVCs.  First-degree AV block.  Significant baseline artifact secondary to muscle tremor.  Left ventricular strain pattern, unchanged since 06/01/14.   Recent Labs: 04/11/2015: ALT 10*; B Natriuretic Peptide 447.4* 04/12/2015: BUN 17; Creatinine, Ser 1.40*; Hemoglobin 10.7*; Platelets 331; Potassium 4.1; Sodium 135    Lipid Panel    Component Value Date/Time   CHOL 131  10/05/2014 0923   TRIG 101.0 10/05/2014 0923   HDL 43.10 10/05/2014 0923   CHOLHDL 3 10/05/2014 0923   VLDL 20.2 10/05/2014 0923   LDLCALC 68 10/05/2014 0923      Wt Readings from Last 3 Encounters:  04/24/15 163 lb 12.8 oz (74.299 kg)  04/12/15 177 lb 7.5 oz (80.5 kg)  10/05/14 173 lb 12.8 oz (78.835 kg)         ASSESSMENT AND PLAN:  1. ischemic heart disease status post stent to his right coronary artery in 2001 2. essential hypertension 3. hypertrophic obstructive cardiomyopathy with systolic murmur at base 4. old cerebrovascular accident.  Poor balance. 5. insulin-dependent diabetes mellitus.  6. Asymptomatic PVCs 7.  Recent hospitalization for community acquired pneumonia.  Physician: Continue same medication.  Recheck in 4 months for office visit EKG lipid panel hepatic function panel basal metabolic panel and A1c. Continue careful diet.   Current medicines are reviewed at length with the patient today.  The patient does not have concerns regarding medicines.  The following changes have been made:  no change.  The patient is not sure if he is still taking Actos or not.  The daughter will check at home.  Labs/ tests ordered today include:   Orders Placed This Encounter  Procedures  . Lipid panel  . Hepatic function panel  . Basic metabolic panel  . Hemoglobin A1c  . EKG 12-Lead   Disposition: Continue current medication.  We are checking lab work today including lipid panel hepatic function panel basal metabolic panel and A1c.  Recheck in 4 months for office visit and fasting lipid panel hepatic function panel and basal metabolic panel.  Karie SchwalbeSigned, Delainie Chavana MD 04/24/2015 1:03 PM    [facility] [address], [place], [place]  [address] Phone: [phone]) [phone]; Fax: [phone]

## 2015-04-24 NOTE — Patient Instructions (Signed)
Medication Instructions:  Your physician recommends that you continue on your current medications as directed. Please refer to the Current Medication list given to you today.  Labwork: Lp/bmet/hfp/a1c  Testing/Procedures: none  Follow-Up: Your physician recommends that you schedule a follow-up appointment in: 4 months with fasting labs (lp/bmet/hfp) with Dawayne PatriciaLori G NP or Bing NeighborsScott W PA  If you need a refill on your cardiac medications before your next appointment, please call your pharmacy.

## 2015-04-25 NOTE — Progress Notes (Signed)
Quick Note:  Please report to patient. The recent labs are stable. Continue same medication and careful diet. ______ 

## 2015-05-01 ENCOUNTER — Other Ambulatory Visit: Payer: Self-pay | Admitting: Cardiology

## 2015-05-02 ENCOUNTER — Emergency Department (HOSPITAL_COMMUNITY): Payer: Medicare Other

## 2015-05-02 ENCOUNTER — Inpatient Hospital Stay (HOSPITAL_COMMUNITY)
Admission: EM | Admit: 2015-05-02 | Discharge: 2015-05-18 | DRG: 064 | Disposition: E | Payer: Medicare Other | Attending: Pulmonary Disease | Admitting: Pulmonary Disease

## 2015-05-02 ENCOUNTER — Inpatient Hospital Stay (HOSPITAL_COMMUNITY): Payer: Medicare Other

## 2015-05-02 ENCOUNTER — Encounter (HOSPITAL_COMMUNITY): Payer: Self-pay | Admitting: Radiology

## 2015-05-02 DIAGNOSIS — J810 Acute pulmonary edema: Secondary | ICD-10-CM | POA: Diagnosis present

## 2015-05-02 DIAGNOSIS — H53461 Homonymous bilateral field defects, right side: Secondary | ICD-10-CM | POA: Diagnosis present

## 2015-05-02 DIAGNOSIS — G8194 Hemiplegia, unspecified affecting left nondominant side: Secondary | ICD-10-CM | POA: Diagnosis present

## 2015-05-02 DIAGNOSIS — R680 Hypothermia, not associated with low environmental temperature: Secondary | ICD-10-CM | POA: Diagnosis not present

## 2015-05-02 DIAGNOSIS — I69931 Monoplegia of upper limb following unspecified cerebrovascular disease affecting right dominant side: Secondary | ICD-10-CM

## 2015-05-02 DIAGNOSIS — I10 Essential (primary) hypertension: Secondary | ICD-10-CM | POA: Diagnosis present

## 2015-05-02 DIAGNOSIS — E785 Hyperlipidemia, unspecified: Secondary | ICD-10-CM | POA: Diagnosis present

## 2015-05-02 DIAGNOSIS — Z87891 Personal history of nicotine dependence: Secondary | ICD-10-CM

## 2015-05-02 DIAGNOSIS — Z794 Long term (current) use of insulin: Secondary | ICD-10-CM | POA: Diagnosis not present

## 2015-05-02 DIAGNOSIS — Z7902 Long term (current) use of antithrombotics/antiplatelets: Secondary | ICD-10-CM | POA: Diagnosis not present

## 2015-05-02 DIAGNOSIS — D72829 Elevated white blood cell count, unspecified: Secondary | ICD-10-CM | POA: Diagnosis present

## 2015-05-02 DIAGNOSIS — E119 Type 2 diabetes mellitus without complications: Secondary | ICD-10-CM | POA: Diagnosis present

## 2015-05-02 DIAGNOSIS — Z79899 Other long term (current) drug therapy: Secondary | ICD-10-CM

## 2015-05-02 DIAGNOSIS — I2510 Atherosclerotic heart disease of native coronary artery without angina pectoris: Secondary | ICD-10-CM | POA: Diagnosis present

## 2015-05-02 DIAGNOSIS — I6521 Occlusion and stenosis of right carotid artery: Secondary | ICD-10-CM | POA: Diagnosis not present

## 2015-05-02 DIAGNOSIS — Z66 Do not resuscitate: Secondary | ICD-10-CM | POA: Diagnosis not present

## 2015-05-02 DIAGNOSIS — Z8249 Family history of ischemic heart disease and other diseases of the circulatory system: Secondary | ICD-10-CM

## 2015-05-02 DIAGNOSIS — Z5309 Procedure and treatment not carried out because of other contraindication: Secondary | ICD-10-CM

## 2015-05-02 DIAGNOSIS — Z833 Family history of diabetes mellitus: Secondary | ICD-10-CM | POA: Diagnosis not present

## 2015-05-02 DIAGNOSIS — R400 Somnolence: Secondary | ICD-10-CM

## 2015-05-02 DIAGNOSIS — I639 Cerebral infarction, unspecified: Secondary | ICD-10-CM | POA: Diagnosis present

## 2015-05-02 DIAGNOSIS — Z955 Presence of coronary angioplasty implant and graft: Secondary | ICD-10-CM | POA: Diagnosis not present

## 2015-05-02 DIAGNOSIS — Z888 Allergy status to other drugs, medicaments and biological substances status: Secondary | ICD-10-CM | POA: Diagnosis not present

## 2015-05-02 DIAGNOSIS — J9601 Acute respiratory failure with hypoxia: Secondary | ICD-10-CM | POA: Diagnosis present

## 2015-05-02 DIAGNOSIS — R011 Cardiac murmur, unspecified: Secondary | ICD-10-CM

## 2015-05-02 DIAGNOSIS — R4182 Altered mental status, unspecified: Secondary | ICD-10-CM | POA: Diagnosis present

## 2015-05-02 DIAGNOSIS — E876 Hypokalemia: Secondary | ICD-10-CM | POA: Diagnosis present

## 2015-05-02 DIAGNOSIS — I421 Obstructive hypertrophic cardiomyopathy: Secondary | ICD-10-CM | POA: Diagnosis present

## 2015-05-02 DIAGNOSIS — Z515 Encounter for palliative care: Secondary | ICD-10-CM

## 2015-05-02 DIAGNOSIS — I6330 Cerebral infarction due to thrombosis of unspecified cerebral artery: Secondary | ICD-10-CM | POA: Diagnosis not present

## 2015-05-02 DIAGNOSIS — I63512 Cerebral infarction due to unspecified occlusion or stenosis of left middle cerebral artery: Secondary | ICD-10-CM | POA: Diagnosis present

## 2015-05-02 DIAGNOSIS — R29730 NIHSS score 30: Secondary | ICD-10-CM | POA: Diagnosis present

## 2015-05-02 DIAGNOSIS — G9340 Encephalopathy, unspecified: Secondary | ICD-10-CM | POA: Diagnosis present

## 2015-05-02 DIAGNOSIS — I248 Other forms of acute ischemic heart disease: Secondary | ICD-10-CM | POA: Diagnosis present

## 2015-05-02 DIAGNOSIS — J9600 Acute respiratory failure, unspecified whether with hypoxia or hypercapnia: Secondary | ICD-10-CM

## 2015-05-02 DIAGNOSIS — I252 Old myocardial infarction: Secondary | ICD-10-CM | POA: Diagnosis not present

## 2015-05-02 DIAGNOSIS — I6789 Other cerebrovascular disease: Secondary | ICD-10-CM | POA: Diagnosis not present

## 2015-05-02 DIAGNOSIS — Z4659 Encounter for fitting and adjustment of other gastrointestinal appliance and device: Secondary | ICD-10-CM

## 2015-05-02 DIAGNOSIS — Z960 Presence of urogenital implants: Secondary | ICD-10-CM | POA: Diagnosis present

## 2015-05-02 DIAGNOSIS — J9621 Acute and chronic respiratory failure with hypoxia: Secondary | ICD-10-CM | POA: Diagnosis not present

## 2015-05-02 HISTORY — DX: Alcoholic cirrhosis of liver without ascites: K70.30

## 2015-05-02 LAB — RAPID URINE DRUG SCREEN, HOSP PERFORMED
AMPHETAMINES: NOT DETECTED
BARBITURATES: NOT DETECTED
BENZODIAZEPINES: POSITIVE — AB
COCAINE: NOT DETECTED
Opiates: NOT DETECTED
TETRAHYDROCANNABINOL: NOT DETECTED

## 2015-05-02 LAB — I-STAT TROPONIN, ED: Troponin i, poc: 0.03 ng/mL (ref 0.00–0.08)

## 2015-05-02 LAB — URINALYSIS, ROUTINE W REFLEX MICROSCOPIC
BILIRUBIN URINE: NEGATIVE
Glucose, UA: NEGATIVE mg/dL
Hgb urine dipstick: NEGATIVE
Ketones, ur: 15 mg/dL — AB
Leukocytes, UA: NEGATIVE
Nitrite: NEGATIVE
Protein, ur: NEGATIVE mg/dL
SPECIFIC GRAVITY, URINE: 1.023 (ref 1.005–1.030)
pH: 7 (ref 5.0–8.0)

## 2015-05-02 LAB — DIFFERENTIAL
BASOS ABS: 0 10*3/uL (ref 0.0–0.1)
Basophils Relative: 0 %
EOS ABS: 0.1 10*3/uL (ref 0.0–0.7)
Eosinophils Relative: 0 %
LYMPHS ABS: 1.5 10*3/uL (ref 0.7–4.0)
LYMPHS PCT: 8 %
Monocytes Absolute: 1 10*3/uL (ref 0.1–1.0)
Monocytes Relative: 5 %
NEUTROS PCT: 87 %
Neutro Abs: 15.7 10*3/uL — ABNORMAL HIGH (ref 1.7–7.7)

## 2015-05-02 LAB — I-STAT CHEM 8, ED
BUN: 21 mg/dL — AB (ref 6–20)
CHLORIDE: 103 mmol/L (ref 101–111)
CREATININE: 1.5 mg/dL — AB (ref 0.61–1.24)
Calcium, Ion: 1.18 mmol/L (ref 1.13–1.30)
Glucose, Bld: 121 mg/dL — ABNORMAL HIGH (ref 65–99)
HEMATOCRIT: 44 % (ref 39.0–52.0)
Hemoglobin: 15 g/dL (ref 13.0–17.0)
POTASSIUM: 3.4 mmol/L — AB (ref 3.5–5.1)
Sodium: 141 mmol/L (ref 135–145)
TCO2: 22 mmol/L (ref 0–100)

## 2015-05-02 LAB — CBG MONITORING, ED: GLUCOSE-CAPILLARY: 148 mg/dL — AB (ref 65–99)

## 2015-05-02 LAB — PROTIME-INR
INR: 1.2 (ref 0.00–1.49)
Prothrombin Time: 15.4 seconds — ABNORMAL HIGH (ref 11.6–15.2)

## 2015-05-02 LAB — COMPREHENSIVE METABOLIC PANEL
ALBUMIN: 3.7 g/dL (ref 3.5–5.0)
ALT: 10 U/L — ABNORMAL LOW (ref 17–63)
AST: 19 U/L (ref 15–41)
Alkaline Phosphatase: 56 U/L (ref 38–126)
Anion gap: 9 (ref 5–15)
BILIRUBIN TOTAL: 0.7 mg/dL (ref 0.3–1.2)
BUN: 20 mg/dL (ref 6–20)
CHLORIDE: 104 mmol/L (ref 101–111)
CO2: 24 mmol/L (ref 22–32)
Calcium: 9.5 mg/dL (ref 8.9–10.3)
Creatinine, Ser: 1.67 mg/dL — ABNORMAL HIGH (ref 0.61–1.24)
GFR calc Af Amer: 42 mL/min — ABNORMAL LOW (ref >60)
GFR calc non Af Amer: 37 mL/min — ABNORMAL LOW (ref >60)
GLUCOSE: 124 mg/dL — AB (ref 65–99)
POTASSIUM: 3.4 mmol/L — AB (ref 3.5–5.1)
Sodium: 137 mmol/L (ref 135–145)
TOTAL PROTEIN: 7.4 g/dL (ref 6.5–8.1)

## 2015-05-02 LAB — I-STAT ARTERIAL BLOOD GAS, ED
ACID-BASE DEFICIT: 1 mmol/L (ref 0.0–2.0)
BICARBONATE: 23.4 meq/L (ref 20.0–24.0)
O2 SAT: 100 %
PH ART: 7.39 (ref 7.350–7.450)
PO2 ART: 262 mmHg — AB (ref 80.0–100.0)
TCO2: 25 mmol/L (ref 0–100)
pCO2 arterial: 38.7 mmHg (ref 35.0–45.0)

## 2015-05-02 LAB — VITAMIN B12: VITAMIN B 12: 225 pg/mL (ref 180–914)

## 2015-05-02 LAB — FOLATE: FOLATE: 17.7 ng/mL (ref >5.9)

## 2015-05-02 LAB — CBC
HCT: 38.8 % — ABNORMAL LOW (ref 39.0–52.0)
HEMOGLOBIN: 12.3 g/dL — AB (ref 13.0–17.0)
MCH: 26.4 pg (ref 26.0–34.0)
MCHC: 31.7 g/dL (ref 30.0–36.0)
MCV: 83.3 fL (ref 78.0–100.0)
Platelets: 414 10*3/uL — ABNORMAL HIGH (ref 150–400)
RBC: 4.66 MIL/uL (ref 4.22–5.81)
RDW: 17.2 % — ABNORMAL HIGH (ref 11.5–15.5)
WBC: 18.2 10*3/uL — AB (ref 4.0–10.5)

## 2015-05-02 LAB — ETHANOL: Alcohol, Ethyl (B): <5 mg/dL (ref <5)

## 2015-05-02 LAB — TROPONIN I

## 2015-05-02 LAB — TSH: TSH: 1.442 u[IU]/mL (ref 0.350–4.500)

## 2015-05-02 LAB — PROCALCITONIN: Procalcitonin: 0.2 ng/mL

## 2015-05-02 LAB — AMMONIA: AMMONIA: 25 umol/L (ref 9–35)

## 2015-05-02 LAB — APTT: APTT: 32 s (ref 24–37)

## 2015-05-02 LAB — MRSA PCR SCREENING: MRSA by PCR: NEGATIVE

## 2015-05-02 MED ORDER — MIDAZOLAM HCL 2 MG/2ML IJ SOLN: 1.0000 mg | INTRAMUSCULAR | Status: DC | PRN

## 2015-05-02 MED ORDER — CLOPIDOGREL BISULFATE 75 MG PO TABS
75.0000 mg | ORAL_TABLET | Freq: Every day | ORAL | Status: DC
  Administered 2015-05-02 – 2015-05-04 (×3): 75 mg
  Filled 2015-05-02 (×3): qty 1

## 2015-05-02 MED ORDER — LABETALOL HCL 5 MG/ML IV SOLN: 20.0000 mg | INTRAVENOUS | Status: DC | PRN

## 2015-05-02 MED ORDER — FENTANYL CITRATE (PF) 100 MCG/2ML IJ SOLN
50.0000 ug | INTRAMUSCULAR | Status: DC | PRN
  Administered 2015-05-02: 50 ug via INTRAVENOUS

## 2015-05-02 MED ORDER — HEPARIN SODIUM (PORCINE) 5000 UNIT/ML IJ SOLN
5000.0000 [IU] | Freq: Three times a day (TID) | INTRAMUSCULAR | Status: DC
  Administered 2015-05-02 – 2015-05-04 (×5): 5000 [IU] via SUBCUTANEOUS
  Filled 2015-05-02 (×4): qty 1

## 2015-05-02 MED ORDER — PANTOPRAZOLE SODIUM 40 MG IV SOLR
40.0000 mg | Freq: Every day | INTRAVENOUS | Status: DC
  Administered 2015-05-02: 40 mg via INTRAVENOUS
  Filled 2015-05-02: qty 40

## 2015-05-02 MED ORDER — FENTANYL CITRATE (PF) 100 MCG/2ML IJ SOLN
INTRAMUSCULAR | Status: AC
  Filled 2015-05-02: qty 2

## 2015-05-02 MED ORDER — MIDAZOLAM HCL 2 MG/2ML IJ SOLN
INTRAMUSCULAR | Status: AC
  Administered 2015-05-02: 2 mg
  Filled 2015-05-02: qty 2

## 2015-05-02 MED ORDER — LORAZEPAM 2 MG/ML IJ SOLN
INTRAMUSCULAR | Status: AC
  Administered 2015-05-02: 0.5 mg
  Filled 2015-05-02: qty 1

## 2015-05-02 MED ORDER — POTASSIUM CHLORIDE 20 MEQ/15ML (10%) PO SOLN
40.0000 meq | Freq: Once | ORAL | Status: AC
  Filled 2015-05-02: qty 30

## 2015-05-02 MED ORDER — ANTISEPTIC ORAL RINSE SOLUTION (CORINZ)
7.0000 mL | OROMUCOSAL | Status: DC
  Administered 2015-05-02 – 2015-05-04 (×16): 7 mL via OROMUCOSAL

## 2015-05-02 MED ORDER — ETOMIDATE 2 MG/ML IV SOLN
10.0000 mg | Freq: Once | INTRAVENOUS | Status: AC
  Administered 2015-05-02: 10 mg via INTRAVENOUS

## 2015-05-02 MED ORDER — ROCURONIUM BROMIDE 50 MG/5ML IV SOLN
100.0000 mg | Freq: Once | INTRAVENOUS | Status: AC
  Administered 2015-05-02: 100 mg via INTRAVENOUS
  Filled 2015-05-02: qty 10

## 2015-05-02 MED ORDER — PHENYLEPHRINE 40 MCG/ML (10ML) SYRINGE FOR IV PUSH (FOR BLOOD PRESSURE SUPPORT)
PREFILLED_SYRINGE | INTRAVENOUS | Status: AC
  Filled 2015-05-02: qty 10

## 2015-05-02 MED ORDER — CHLORHEXIDINE GLUCONATE 0.12% ORAL RINSE (MEDLINE KIT)
15.0000 mL | Freq: Two times a day (BID) | OROMUCOSAL | Status: DC
  Administered 2015-05-02 – 2015-05-04 (×4): 15 mL via OROMUCOSAL

## 2015-05-02 MED ORDER — SODIUM CHLORIDE 0.9 % IV SOLN
INTRAVENOUS | Status: DC
  Administered 2015-05-02 – 2015-05-04 (×3): via INTRAVENOUS

## 2015-05-02 MED ORDER — SODIUM CHLORIDE 0.9 % IV SOLN: 250.0000 mL | INTRAVENOUS | Status: DC | PRN

## 2015-05-02 MED ORDER — PRAVASTATIN SODIUM 20 MG PO TABS
10.0000 mg | ORAL_TABLET | Freq: Every day | ORAL | Status: DC
  Administered 2015-05-03: 10 mg
  Filled 2015-05-02: qty 1

## 2015-05-02 MED ORDER — HYDRALAZINE HCL 20 MG/ML IJ SOLN
10.0000 mg | INTRAMUSCULAR | Status: DC | PRN
  Administered 2015-05-03: 20 mg via INTRAVENOUS
  Filled 2015-05-02: qty 1

## 2015-05-02 MED ORDER — INSULIN ASPART 100 UNIT/ML ~~LOC~~ SOLN
0.0000 [IU] | SUBCUTANEOUS | Status: DC
  Administered 2015-05-02: 5 [IU] via SUBCUTANEOUS
  Administered 2015-05-03: 2 [IU] via SUBCUTANEOUS
  Administered 2015-05-03: 5 [IU] via SUBCUTANEOUS
  Administered 2015-05-03: 2 [IU] via SUBCUTANEOUS
  Administered 2015-05-03: 3 [IU] via SUBCUTANEOUS
  Administered 2015-05-03: 2 [IU] via SUBCUTANEOUS
  Administered 2015-05-04: 5 [IU] via SUBCUTANEOUS
  Administered 2015-05-04 (×2): 3 [IU] via SUBCUTANEOUS

## 2015-05-02 MED ORDER — FENTANYL CITRATE (PF) 100 MCG/2ML IJ SOLN
50.0000 ug | INTRAMUSCULAR | Status: DC | PRN
  Administered 2015-05-04: 50 ug via INTRAVENOUS
  Filled 2015-05-02 (×2): qty 2

## 2015-05-02 MED ORDER — FUROSEMIDE 10 MG/ML IJ SOLN
40.0000 mg | Freq: Once | INTRAMUSCULAR | Status: AC
  Administered 2015-05-02: 40 mg via INTRAVENOUS
  Filled 2015-05-02: qty 4

## 2015-05-02 MED ORDER — POTASSIUM CHLORIDE 20 MEQ/15ML (10%) PO SOLN
40.0000 meq | Freq: Once | ORAL | Status: AC
  Administered 2015-05-02: 40 meq

## 2015-05-02 NOTE — Progress Notes (Signed)
Pt admitted to 3M5,uneventful trip. Pt transported on an FiO2 of 100%. Report given to unit RT. RN at bedside.

## 2015-05-02 NOTE — Code Documentation (Signed)
81[facility] at [phone].  Per EMS patient suddenly became nonverbal and unable to follow commands while at home with family at [id number].  Patient with limited movement only moving left hand at times per EMS.  Code stroke activated.  Stroke team at the bedside on patient arrival.  Labs drawn and patient cleared for CT by Dr. Adela LankFloyd.  Patient to CT.  Patient to Trauma A.  NIHSS 30, see documentation for details and code stroke times.  Patient continues to be nonverbal, unable to follow commands with left gaze preference and no movement to all extremities.  Concern for seizure activity per Dr. Lavon PaganiniNandigam.  Ativan IVP given per order.  Patient to MRI per MD with stroke team and ED RN.  Further information from patient's family indicating that LKW actually 1030.  Patient required PCXR and KUB prior to MRI.  Concerns for patient's respiratory status prior to MRI and patient transported back to Trauma A for intubation.  Patient then to CT for CTA which was negative for large vessel occlusion per Dr. Lavon PaganiniNandigam.  Patient back to Trauma A and EEG at the bedside.  No acute stroke treatment at this time.  Bedside handoff with ED RN Denny PeonErin.

## 2015-05-02 NOTE — ED Notes (Signed)
Critical care PA at bedside.  

## 2015-05-02 NOTE — ED Notes (Signed)
Patient transported to MRI 

## 2015-05-02 NOTE — Progress Notes (Signed)
EEG completed; results pending.    

## 2015-05-02 NOTE — Progress Notes (Signed)
Patient transported to CT and back to trauma room A without any complications.

## 2015-05-02 NOTE — ED Notes (Signed)
EEG at bedside.

## 2015-05-02 NOTE — Consult Note (Signed)
Referring Physician: Adela LankFloyd    Chief Complaint:  Code stroke  HPI:                                                                                                                                         [name] is an 81 y.o. male who was with his wife at 10:30 this AM. He was watching TV from 10:30 -13:30 but did not talk to anyone during this time.  At 13:30 he was noted to slump over and not be responsive. He was brought to hospital  And noted to have Reed arm in flexion, Reed eye deviation, right hemianopsia.  It was not clear if he was having seizures versus CVA.  HE was brought to MRI to evaluate for stroke. There was a delay due to needing chest Xray and confirmation penile implant.   Date last known well: Date: 07/31/2014 Time last known well: Time: 10:30 tPA Given: No: out of window.     Past Medical History  Diagnosis Date  . Hypertension   . Hyperlipidemia   . Diabetes mellitus     type 2  . Cerebrovascular accident [facility](HCC)     Old  . Hypertrophic obstructive cardiomyopathy     hystory of   . Peripheral edema   . Hypokalemia   . Acute inferolateral myocardial infarction (HCC) 12/23/99  . Coronary artery disease     cardiac catheterization in 2001 - Successful PTCA and stenting of the mid right coronary artery -- Well preserved Reed ventricular systolic function -- severe three-vessel coronary artery disease including a severe disease involving the right coronary artery     Past Surgical History  Procedure Laterality Date  . Cholecystectomy    . Colectomy    . Tonsillectomy    . Cardiac catheterization  12/23/99    Successful PTCA and stenting of the mid right coronary artery -- Well preserved Reed ventricular systolic function -- severe three-vessel coronary artery disease including a severe disease involving the right coronary artery     Family History  Problem Relation Age of Onset  . Arthritis Mother   . Heart disease Mother   . Hypertension Mother   .  Diabetes Mother   . Heart disease Brother   . Diabetes Brother    Social History:  reports that he quit smoking about 21 years ago. His smoking use included Cigarettes. He has a 45 pack-year smoking history. He does not have any smokeless tobacco history on file. He reports that he does not drink alcohol or use illicit drugs.  Allergies:  Allergies  Allergen Reactions  . Zyban [Bupropion Hcl]     unknown    Medications:  Current Facility-Administered Medications  Medication Dose Route Frequency Provider Last Rate Last Dose  . LORazepam (ATIVAN) 2 MG/ML injection            Current Outpatient Prescriptions  Medication Sig Dispense Refill  . amLODipine (NORVASC) 5 MG tablet TAKE 1 TABLET BY MOUTH EVERY DAY 90 tablet 0  . B-D INS SYRINGE 0.5CC/30GX1/2" 30G X 1/2" 0.5 ML MISC USE AS DIRECTED 100 each prn  . clopidogrel (PLAVIX) 75 MG tablet TAKE 1 TABLET BY MOUTH EVERY DAY 90 tablet 0  . fexofenadine (ALLEGRA) 60 MG tablet TAKE 1 TABLET BY MOUTH EVERY DAY 90 tablet 0  . hydrochlorothiazide (MICROZIDE) 12.5 MG capsule TAKE 1 CAPSULE (12.5 MG TOTAL) BY MOUTH DAILY. 90 capsule 1  . insulin NPH-regular Human (HUMULIN 70/30) (70-30) 100 UNIT/ML injection Inject 28 Units into the skin daily. 10 mL 12  . isosorbide mononitrate (IMDUR) 60 MG 24 hr tablet TAKE 1 TABLET BY MOUTH IN THE MORNING 90 tablet 1  . LANTUS 100 UNIT/ML injection INJECT 10 UNITS SUBCUTANEOUSLY AT BEDTIME 10 mL 11  . lovastatin (MEVACOR) 10 MG tablet TAKE 1 TABLET BY MOUTH AT BEDTIME 90 tablet 3  . metoprolol (LOPRESSOR) 100 MG tablet TAKE 1/2 TABLET BY MOUTH DAILY OR AS DIRECTED 45 tablet 1  . metoprolol (LOPRESSOR) 50 MG tablet Take 50 mg by mouth every evening.    . nitroGLYCERIN (NITROSTAT) 0.4 MG SL tablet Place 1 tablet (0.4 mg total) under the tongue every 5 (five) minutes as needed. 25 tablet prn   . ONE TOUCH ULTRA TEST test strip ONE TOUCH ULTRA TEST STRIPS TESTING 3 TIMES A DAY AND AS NEEDED. DIAGNOSES CODE 250.02 100 each prn  . pioglitazone (ACTOS) 30 MG tablet Take 30 mg by mouth daily.    . ramipril (ALTACE) 10 MG capsule TAKE 1 CAPSULE (10 MG TOTAL) BY MOUTH DAILY. 90 capsule 1     ROS:                                                                                                                                       History obtained from unobtainable from patient due to mental status   Neurologic Examination:                                                                                                      SpO2 98 %.  HEENT-  Normocephalic, no lesions, without obvious abnormality.  Normal external eye and conjunctiva.   Normal  external ears. Normal external nose, mucus membranes   Cardiovascular- irregularly irregular rhythm, pulses palpable throughout     Neurological Examination Mental Status: Drowsy, not following commands. Globally mute.  Cranial Nerves: II: D  Blinks , pupils equal, round, reactive to light and accommodation III,IV, VI: ptosis not present, Reed gaze preference V,VII: face grossly appears symmetri, corneal reflex inttact VIII: unable to assess IX,X: unable to assess XI: unable to assess XII: midline tongue extension Motor [place]: Minimal withdrawal to tactile stimulus in all 4 limbs.   Deep Tendon Reflexes: more brisk in right UE/LE compared to Reed.  Plantars: Up going bilaterally Cerebellar: Unable to perform Gait:unable to perform    Lab Results: Basic Metabolic Panel:  Recent Labs Lab 2015/05/29 1533  NA 141  K 3.4*  CL 103  GLUCOSE 121*  BUN 21*  CREATININE 1.50*    Liver Function Tests: No results for input(s): AST, ALT, ALKPHOS, BILITOT, PROT, ALBUMIN in the last 168 hours. No results for input(s): LIPASE, AMYLASE in the last 168 hours. No results for input(s): AMMONIA in the last 168 hours.  CBC:  Recent  Labs Lab 05/29/2015 1527 05/29/15 1533  WBC 18.2*  --   NEUTROABS 15.7*  --   HGB 12.3* 15.0  HCT 38.8* 44.0  MCV 83.3  --   PLT 414*  --     Cardiac Enzymes: No results for input(s): CKTOTAL, CKMB, CKMBINDEX, TROPONINI in the last 168 hours.  Lipid Panel: No results for input(s): CHOL, TRIG, HDL, CHOLHDL, VLDL, LDLCALC in the last 168 hours.  CBG:  Recent Labs Lab May 29, 2015 1544  GLUCAP 148*    Microbiology: Results for orders placed or performed during the hospital encounter of 04/10/15  Urine culture     Status: None   Collection Time: 04/10/15  4:17 PM  Result Value Ref Range Status   Specimen Description URINE, RANDOM  Final   Special Requests ADDED 0214 04/11/15  Final   Culture NO GROWTH 1 DAY  Final   Report Status 04/12/2015 FINAL  Final  Culture, blood (x 2)     Status: None   Collection Time: 04/11/15  1:12 AM  Result Value Ref Range Status   Specimen Description BLOOD RIGHT ARM  Final   Special Requests IN PEDIATRIC BOTTLE 4CC  Final   Culture NO GROWTH 5 DAYS  Final   Report Status 04/16/2015 FINAL  Final  Culture, blood (x 2)     Status: None   Collection Time: 04/11/15  1:20 AM  Result Value Ref Range Status   Specimen Description BLOOD Reed HAND  Final   Special Requests IN PEDIATRIC BOTTLE 4CC  Final   Culture NO GROWTH 5 DAYS  Final   Report Status 04/16/2015 FINAL  Final    Coagulation Studies:  Recent Labs  29-May-2015 1527  LABPROT 15.4*  INR 1.20    Imaging: Ct Head Wo Contrast  May 29, 2015  CLINICAL DATA:  Code stroke. History of remote strokes. Unable to move Reed hand. EXAM: CT HEAD WITHOUT CONTRAST TECHNIQUE: Contiguous axial images were obtained from the base of the skull through the vertex without intravenous contrast. COMPARISON:  07/10/2008 FINDINGS: Remote bilateral cerebellar lacunar infarcts. Right upper cerebellar remote infarct, likewise stable from 2010. Larger Reed parietal lobe infarct is chronic and similar to 2010.  Scattered small lacunar infarcts in the basal ganglia and external capsules. Periventricular white matter and corona radiata hypodensities favor chronic ischemic microvascular white matter disease. Remote lacunar infarct in the right thalamus.  Remote right frontal lobe infarct, image 24 series 2. No intracranial hemorrhage, mass lesion, or acute CVA. Chronic bilateral maxillary sinusitis. There is atherosclerotic calcification of the cavernous carotid arteries bilaterally. IMPRESSION: 1. Remote infarcts in the Reed parietal lobe and right frontal lobe with scattered remote lacunar infarcts, but no acute intracranial findings are identified on today's CT scan. 2. Periventricular white matter and corona radiata hypodensities favor chronic ischemic microvascular white matter disease. 3. Chronic bilateral maxillary sinusitis. Electronically Signed   By: Gaylyn Rong M.D.   On: 2015/05/13 15:49    Assessment: 81 y.o. male brought in with acute AMS, ? Etiology. Unable to obtain clear history from family. Even time of onset is unclear. No definite sz like activity.  CT brain revealed chronic b/l MCA infarcts.  CTA head and neck showed high grade (>90%) stenosis of proximal right ICA .  A stat EEG was done bedside, showed no abnormal epileptiform activity or seizures.   Etiology for this acute AMS remains unclear. DD includes acute stroke given the high grade ICA stenosis, ? Seizures from b/l cortical based chr infarcts, ? Hypoxic encephalopathy, pt developed resp failure and required intubation.   Will be admitted to ICU. Intubated, sedated.  Will try to obtain a brain mri tomor after clearance from radiology. He ahs a penile implant, not sure if mri compatible.   Will f/u.

## 2015-05-02 NOTE — ED Notes (Signed)
[facility]  According to EMS, the patient was normal and then family said he became non-verbal.  The patient has had a stroke in the past, and does have right hand weakness.  He is alert but not responding to any commands.  The patient was transported here to be evaluated.

## 2015-05-02 NOTE — ED Notes (Signed)
Pt transported by stroke team and RN to MRI.  Pt began to decompensate and [facility].  Adela LankFloyd MD and Celene KrasBrooten MD at bedside preparing for intubation.  Stroke team and neurologist remains at bedside.

## 2015-05-02 NOTE — ED Notes (Signed)
CBG 148 

## 2015-05-02 NOTE — ED Provider Notes (Signed)
CSN: [id number]     Arrival date & time 04/26/2015  1523 History   First MD Initiated Contact with Patient 05/15/2015 1526     Chief Complaint  Patient presents with  . Code Stroke    The patient comes from home by [facility].  According to EMS, the patient was normal and then family said he became non-verbal.  The patient has had a stroke in the past, and does have right hand weakness.    An emergency department physician performed an initial assessment on this suspected stroke patient at 63.   HISTORY, ROS, AND PHYSICAL EXAM LIMITED BY CRITICAL CONDITION OF PATIENT   (Consider location/radiation/quality/duration/timing/severity/associated sxs/prior Treatment) Patient is a 81 y.o. male presenting with altered mental status. The history is provided by the EMS personnel and the patient. The history is limited by the condition of the patient.  Altered Mental Status Presenting symptoms: confusion and unresponsiveness   Severity:  Severe Most recent episode:  Today Episode history:  Single Duration:  1 hour Timing:  Constant Progression:  Unchanged Chronicity:  Recurrent Context: alcohol use   Context: not a nursing home resident, not a recent change in medication, not a recent illness and not a recent infection   Associated symptoms: weakness   Associated symptoms: no seizures and no vomiting     Past Medical History  Diagnosis Date  . Hypertension   . Hyperlipidemia   . Diabetes mellitus     type 2  . Cerebrovascular accident [facility])     Old  . Hypertrophic obstructive cardiomyopathy     hystory of   . Peripheral edema   . Hypokalemia   . Acute inferolateral myocardial infarction (HCC) 12/23/99  . Coronary artery disease     cardiac catheterization in 2001 - Successful PTCA and stenting of the mid right coronary artery -- Well preserved left ventricular systolic function -- severe three-vessel coronary artery disease including a severe disease involving the right  coronary artery    Past Surgical History  Procedure Laterality Date  . Cholecystectomy    . Colectomy    . Tonsillectomy    . Cardiac catheterization  12/23/99    Successful PTCA and stenting of the mid right coronary artery -- Well preserved left ventricular systolic function -- severe three-vessel coronary artery disease including a severe disease involving the right coronary artery   . Penile prosthesis implant     Family History  Problem Relation Age of Onset  . Arthritis Mother   . Heart disease Mother   . Hypertension Mother   . Diabetes Mother   . Heart disease Brother   . Diabetes Brother    Social History  Substance Use Topics  . Smoking status: Former Smoker -- 1.00 packs/day for 45 years    Types: Cigarettes    Quit date: 06/17/1993  . Smokeless tobacco: Not on file  . Alcohol Use: No    Review of Systems  Unable to perform ROS: Mental status change  Gastrointestinal: Negative for vomiting.  Neurological: Positive for facial asymmetry and weakness. Negative for seizures.  Psychiatric/Behavioral: Positive for confusion.      Allergies  Zyban  Home Medications   Prior to Admission medications   Medication Sig Start Date End Date Taking? Authorizing Provider  amLODipine (NORVASC) 5 MG tablet TAKE 1 TABLET BY MOUTH EVERY DAY 03/27/15  Yes Cassell Clement, MD  clopidogrel (PLAVIX) 75 MG tablet TAKE 1 TABLET BY MOUTH EVERY DAY Patient taking differently: TAKE 75 MG  BY MOUTH EVERY DAY 03/27/15  Yes Cassell Clement, MD  fexofenadine (ALLEGRA) 60 MG tablet TAKE 1 TABLET BY MOUTH EVERY DAY 01/26/15  Yes Cassell Clement, MD  hydrochlorothiazide (MICROZIDE) 12.5 MG capsule TAKE 1 CAPSULE (12.5 MG TOTAL) BY MOUTH DAILY. 11/09/14  Yes Cassell Clement, MD  insulin NPH-regular Human (HUMULIN 70/30) (70-30) 100 UNIT/ML injection Inject 28 Units into the skin daily. 06/21/14 07/03/17 Yes Cassell Clement, MD  isosorbide mononitrate (IMDUR) 60 MG 24 hr tablet TAKE 1 TABLET  BY MOUTH IN THE MORNING Patient taking differently: TAKE 60 MG BY MOUTH IN THE MORNING 04/19/15  Yes Cassell Clement, MD  LANTUS 100 UNIT/ML injection INJECT 10 UNITS SUBCUTANEOUSLY AT BEDTIME 03/14/15  Yes Cassell Clement, MD  lovastatin (MEVACOR) 10 MG tablet TAKE 1 TABLET BY MOUTH AT BEDTIME 04/22/2015  Yes Cassell Clement, MD  metoprolol (LOPRESSOR) 100 MG tablet TAKE 1/2 TABLET BY MOUTH DAILY OR AS DIRECTED Patient taking differently: TAKE 50 MG BY MOUTH DAILY 11/10/14  Yes Cassell Clement, MD  ramipril (ALTACE) 10 MG capsule TAKE 1 CAPSULE (10 MG TOTAL) BY MOUTH DAILY. 11/09/14  Yes Cassell Clement, MD  B-D INS SYRINGE 0.5CC/30GX1/2" 30G X 1/2" 0.5 ML MISC USE AS DIRECTED 07/21/14   Cassell Clement, MD  nitroGLYCERIN (NITROSTAT) 0.4 MG SL tablet Place 1 tablet (0.4 mg total) under the tongue every 5 (five) minutes as needed. 10/08/13   Cassell Clement, MD  pioglitazone (ACTOS) 30 MG tablet Take 30 mg by mouth daily.    Historical Provider, MD   BP 179/85 mmHg  Pulse 86  Resp 14  Wt 164 lb 14.5 oz (74.8 kg)  SpO2 100% Physical Exam  Constitutional: He appears well-developed and well-nourished. He appears distressed.  HENT:  Head: Normocephalic and atraumatic.  Right Ear: External ear normal.  Left Ear: External ear normal.  Nose: Nose normal.  Mouth/Throat: Oropharynx is clear and moist. No oropharyngeal exudate.  Eyes: Conjunctivae are normal. Pupils are equal, round, and reactive to light. Right eye exhibits no discharge. Left eye exhibits no discharge. No scleral icterus.  Neck: Normal range of motion. Neck supple. No JVD present. No tracheal deviation present. No thyromegaly present.  Cardiovascular: Normal rate, regular rhythm and intact distal pulses.   Pulmonary/Chest: Effort normal. No stridor. No respiratory distress.  Abdominal: Soft. He exhibits no distension.  Musculoskeletal: Normal range of motion. He exhibits no edema or tenderness.  Lymphadenopathy:    He has no  cervical adenopathy.  Neurological: He is unresponsive. He displays atrophy and tremor. A cranial nerve deficit and sensory deficit is present. He exhibits abnormal muscle tone. He displays seizure activity. GCS eye subscore is 4. GCS verbal subscore is 1. GCS motor subscore is 1.  L side flacid, L facial droop  Skin: Skin is warm. No rash noted. He is diaphoretic. No erythema. There is pallor.  Nursing note and vitals reviewed.   ED Course  .Intubation Date/Time: 05/07/2015 4:10 PM Performed by: Gavin Pound Authorized by: Melene Plan Consent: The procedure was performed in an emergent situation. Required items: required blood products, implants, devices, and special equipment available Patient identity confirmed: arm band Intubation method: direct Patient status: paralyzed (RSI) Preoxygenation: nonrebreather mask Sedatives: etomidate Paralytic: rocuronium Laryngoscope size: Miller 3 Tube size: 7.5 mm Tube type: cuffed Number of attempts: 1 Cords visualized: yes Post-procedure assessment: chest rise,  ETCO2 monitor and CO2 detector Breath sounds: equal and absent over the epigastrium Cuff inflated: yes ETT to lip: 25 cm Chest x-ray interpreted by me  and radiologist. Chest x-ray findings: endotracheal tube in appropriate position Patient tolerance: Patient tolerated the procedure well with no immediate complications   (including critical care time) Labs Review Labs Reviewed  PROTIME-INR - Abnormal; Notable for the following:    Prothrombin Time 15.4 (*)    All other components within normal limits  CBC - Abnormal; Notable for the following:    WBC 18.2 (*)    Hemoglobin 12.3 (*)    HCT 38.8 (*)    RDW 17.2 (*)    Platelets 414 (*)    All other components within normal limits  DIFFERENTIAL - Abnormal; Notable for the following:    Neutro Abs 15.7 (*)    All other components within normal limits  COMPREHENSIVE METABOLIC PANEL - Abnormal; Notable for the following:     Potassium 3.4 (*)    Glucose, Bld 124 (*)    Creatinine, Ser 1.67 (*)    ALT 10 (*)    GFR calc non Af Amer 37 (*)    GFR calc Af Amer 42 (*)    All other components within normal limits  I-STAT CHEM 8, ED - Abnormal; Notable for the following:    Potassium 3.4 (*)    BUN 21 (*)    Creatinine, Ser 1.50 (*)    Glucose, Bld 121 (*)    All other components within normal limits  CBG MONITORING, ED - Abnormal; Notable for the following:    Glucose-Capillary 148 (*)    All other components within normal limits  ETHANOL  APTT  URINE RAPID DRUG SCREEN, HOSP PERFORMED  URINALYSIS, ROUTINE W REFLEX MICROSCOPIC (NOT AT [facility])  BLOOD GAS, ARTERIAL  I-STAT TROPOININ, ED    Imaging Review Dg Abd 1 View  07-01-2014  CLINICAL DATA:  Code stroke, hypertension, hyperlipidemia, diabetes mellitus, coronary disease post MI EXAM: ABDOMEN - 1 VIEW COMPARISON:  Portable exam 1605 hours compared to CT abdomen and pelvis 07/12/2009 FINDINGS: Penile prosthesis. Scattered atherosclerotic calcifications. Increased stool in rectum. Nonobstructive bowel gas pattern. No bowel dilatation or bowel wall thickening. Diffuse osseous demineralization. No definite urinary tract calcification. Surgical clips RIGHT upper quadrant from cholecystectomy. IMPRESSION: Increased stool in rectum. Nonobstructive bowel gas pattern. Electronically Signed   By: Ulyses SouthwardMark  Boles M.D.   On: 07-01-2014 16:37   Ct Head Wo Contrast  07-01-2014  ADDENDUM REPORT: 07-01-2014 16:08 ADDENDUM: The original report was by Dr. Gaylyn RongWalter Liebkemann. The following addendum is by Dr. Gaylyn RongWalter Liebkemann: These results were discussed by telephone at the time of interpretation on 07-01-2014 at 3:59 pm to Dr. Melene PlanAN FLOYD , who verbally acknowledged these results. Electronically Signed   By: Gaylyn RongWalter  Liebkemann M.D.   On: 07-01-2014 16:08  07-01-2014  CLINICAL DATA:  Code stroke. History of remote strokes. Unable to move left hand. EXAM: CT HEAD WITHOUT CONTRAST  TECHNIQUE: Contiguous axial images were obtained from the base of the skull through the vertex without intravenous contrast. COMPARISON:  07/10/2008 FINDINGS: Remote bilateral cerebellar lacunar infarcts. Right upper cerebellar remote infarct, likewise stable from 2010. Larger left parietal lobe infarct is chronic and similar to 2010. Scattered small lacunar infarcts in the basal ganglia and external capsules. Periventricular white matter and corona radiata hypodensities favor chronic ischemic microvascular white matter disease. Remote lacunar infarct in the right thalamus. Remote right frontal lobe infarct, image 24 series 2. No intracranial hemorrhage, mass lesion, or acute CVA. Chronic bilateral maxillary sinusitis. There is atherosclerotic calcification of the cavernous carotid arteries bilaterally. IMPRESSION: 1. Remote infarcts in  the left parietal lobe and right frontal lobe with scattered remote lacunar infarcts, but no acute intracranial findings are identified on today's CT scan. 2. Periventricular white matter and corona radiata hypodensities favor chronic ischemic microvascular white matter disease. 3. Chronic bilateral maxillary sinusitis. Electronically Signed: By: Gaylyn Rong M.D. On: 05-14-2015 15:49   Dg Chest Portable 1 View  2015/05/14  CLINICAL DATA:  Endotracheal tube placement. Hypertension. Acute myocardial infarction. Coronary artery disease. EXAM: PORTABLE CHEST 1 VIEW COMPARISON:  2015/05/14 at 4:01 p.m. FINDINGS: Newly placed endotracheal tube tip 3.6 cm above the carina, well positioned. Nasogastric tube tip: Stomach body. Atherosclerotic aortic arch and descending thoracic aorta. Mild to moderate enlargement of the cardiopericardial silhouette with increasing bilateral interstitial accentuation compatible with acute interstitial pulmonary edema. IMPRESSION: 1. Endotracheal tube well positioned, tip 3.6 cm above the carina. Nasogastric tube tip: Stomach body. 2. Moderate  enlargement of the cardiopericardial silhouette with acute interstitial pulmonary edema. This is mildly worsened since the prior radiograph from 37 minutes before. Electronically Signed   By: Gaylyn Rong M.D.   On: 05-14-15 16:51   Dg Chest Port 1 View  05/14/15  CLINICAL DATA:  Code stroke, unresponsive, hypertension, diabetes mellitus, hyperlipidemia, coronary artery disease post MI, former smoker EXAM: PORTABLE CHEST 1 VIEW COMPARISON:  Portable exam 1601 hours compared to 04/10/2015 FINDINGS: Enlargement of cardiac silhouette. Mediastinal contours and pulmonary vascularity normal. Atherosclerotic calcification and tortuosity of thoracic aorta. Patchy RIGHT perihilar infiltrate with probable mild residual infiltrate at LEFT base as well. Overall slightly improved aeration versus previous exam. No pneumothorax or gross pleural effusion. Bones demineralized. IMPRESSION: Enlargement of cardiac silhouette. Slightly improved BILATERAL pulmonary infiltrates since 04/10/2015. Electronically Signed   By: Ulyses Southward M.D.   On: 05/14/2015 16:36   I have personally reviewed and evaluated these images and lab results as part of my medical decision-making.   EKG Interpretation   Date/Time:  Tuesday 2015/05/14 15:40:15 EST Ventricular Rate:  83 PR Interval:  161 QRS Duration: 110 QT Interval:  426 QTC Calculation: 501 R Axis:   46 Text Interpretation:  Sinus rhythm Multiple premature complexes, vent &  supraven Incomplete left bundle branch block multiple PVC's Baseline  wander Otherwise no significant change Confirmed by FLOYD MD, Reuel Boom  ([id number]) on 05/14/15 3:48:51 PM      MDM   Final diagnoses:  Stroke (cerebrum) (HCC)  MRI contraindicated due to metal implant  CVA (cerebral infarction)    FARHAD BURLESON is a 81 y.o. male patient presenting for CVA.  Pt with acute L sided weakness which started approximately 1 hour prior to arrival.  Pt has prior CVA Hx.    Unable to  participate in exam.  Dense hemiplegia noted.  Pt initially cleared for scanner, but developed respiratory distress and required intubation.    No other acute interventions are indicated in the ED based on lab w/u.  Pt was discussed with neurology and critical care.  Pt admitted for further management.  Patient care was discussed with my attending, Dr. Adela Lank.        Gavin Pound, MD 05/15/2015 6045  Gavin Pound, MD 05/02/2015 0147  Melene Plan, DO 05/12/2015 [phone]

## 2015-05-02 NOTE — H&P (Signed)
PULMONARY / CRITICAL CARE MEDICINE   Name: [name] MRN: [id number] DOB: 24-May-1931    ADMISSION DATE:  05/12/2015 CONSULTATION DATE:  04/18/2015  REFERRING MD :  EDP  CHIEF COMPLAINT:  AMS  INITIAL PRESENTATION:  81 y.o. male brought to [facility] ED 11/15 for AMS.  Code stroke was called in ED; however, there was also concern for seizures.  He was intubated for airway protection and PCCM called for admission.    STUDIES:  CT / CTA head 11/15 >>> no medium or large vessel occlusion. CXR 11/15 >>> ETT in place.  Moderate enlargement of cardiac silhouette with acute interstitial pulmonary edema. EEG 11/15 >>>  SIGNIFICANT EVENTS: 11/15 - admitted with AMS and concern for CVA.   HISTORY OF PRESENT ILLNESS:  Pt is encephalopathic; therefore, this HPI is obtained from chart review. [name] is a 81 y.o. male with a PMH as outlined below. He was brought to [facility] ED 11/15 for AMS.  He apparently was at his home watching TV with his wife around 10:30 AM.  He watched TV from 10:30 till roughly 13:30 but spoke very little during this time.  Around 13:30, he was noticed to be slumped over and not responsive.  He was immediately brought to the [facility] ED for further evaluation as family was concerned for stroke (he has hx of remote stroke with residual right hand weakness).  In ED, code stroke was activated given that he had left arm flexion, left eye deviation, and right hemianopsia.  Neuro evaluated him and it was not clear whether he was having seizures vs CVA.   There was a delay in obtaining MRI given the fact that pt has a penile implant.  On the way to MRI, he began to decompensate and was minimally responsive; therefore, he was intubated for airway protection.  CT and CTA were negative for acute infarct.  EEG was performed and results are pending.  PCCM was called for admission.  Of note, he was intubated around 1630 and received RSI medications.  PCCM was called at 1830 and at the  time of my exam, pt was not responsive despite no sedation since intubation.  Of note, pt had recent admission 04/10/15 through 04/12/15 for CAP (treated with rocephin / azithro and discharged on 5 days augmentin).   PAST MEDICAL HISTORY :   has a past medical history of Hypertension; Hyperlipidemia; Diabetes mellitus; Cerebrovascular accident [facility]); Hypertrophic obstructive cardiomyopathy; Peripheral edema; Hypokalemia; Acute inferolateral myocardial infarction [facility]) (12/23/99); and Coronary artery disease.  has past surgical history that includes Cholecystectomy; Colectomy; Tonsillectomy; Cardiac catheterization (12/23/99); and Penile prosthesis implant. Prior to Admission medications   Medication Sig Start Date End Date Taking? Authorizing Provider  amLODipine (NORVASC) 5 MG tablet TAKE 1 TABLET BY MOUTH EVERY DAY Patient taking differently: TAKE 5 MG BY MOUTH EVERY DAY 03/27/15  Yes Cassell Clement, MD  clopidogrel (PLAVIX) 75 MG tablet TAKE 1 TABLET BY MOUTH EVERY DAY Patient taking differently: TAKE 75 MG BY MOUTH EVERY DAY 03/27/15  Yes Cassell Clement, MD  fexofenadine (ALLEGRA) 60 MG tablet TAKE 1 TABLET BY MOUTH EVERY DAY 01/26/15  Yes Cassell Clement, MD  hydrochlorothiazide (MICROZIDE) 12.5 MG capsule TAKE 1 CAPSULE (12.5 MG TOTAL) BY MOUTH DAILY. 11/09/14  Yes Cassell Clement, MD  insulin NPH-regular Human (HUMULIN 70/30) (70-30) 100 UNIT/ML injection Inject 28 Units into the skin daily. 06/21/14 07/03/17 Yes Cassell Clement, MD  isosorbide mononitrate (IMDUR) 60 MG 24 hr tablet TAKE 1 TABLET BY MOUTH  IN THE MORNING Patient taking differently: TAKE 60 MG BY MOUTH IN THE MORNING 04/19/15  Yes Cassell Clement, MD  LANTUS 100 UNIT/ML injection INJECT 10 UNITS SUBCUTANEOUSLY AT BEDTIME 03/14/15  Yes Cassell Clement, MD  lovastatin (MEVACOR) 10 MG tablet TAKE 1 TABLET BY MOUTH AT BEDTIME 05/07/2015  Yes Cassell Clement, MD  metoprolol (LOPRESSOR) 100 MG tablet TAKE 1/2 TABLET BY MOUTH  DAILY OR AS DIRECTED Patient taking differently: TAKE 50 MG BY MOUTH DAILY 11/10/14  Yes Cassell Clement, MD  nitroGLYCERIN (NITROSTAT) 0.4 MG SL tablet Place 1 tablet (0.4 mg total) under the tongue every 5 (five) minutes as needed. 10/08/13  Yes Cassell Clement, MD  ramipril (ALTACE) 10 MG capsule TAKE 1 CAPSULE (10 MG TOTAL) BY MOUTH DAILY. 11/09/14  Yes Cassell Clement, MD  B-D INS SYRINGE 0.5CC/30GX1/2" 30G X 1/2" 0.5 ML MISC USE AS DIRECTED 07/21/14   Cassell Clement, MD   Allergies  Allergen Reactions  . Zyban [Bupropion Hcl]     unknown    FAMILY HISTORY:  Family History  Problem Relation Age of Onset  . Arthritis Mother   . Heart disease Mother   . Hypertension Mother   . Diabetes Mother   . Heart disease Brother   . Diabetes Brother     SOCIAL HISTORY:  reports that he quit smoking about 21 years ago. His smoking use included Cigarettes. He has a 45 pack-year smoking history. He does not have any smokeless tobacco history on file. He reports that he does not drink alcohol or use illicit drugs.  REVIEW OF SYSTEMS:  Unable to obtain as pt is encephalopathic.  SUBJECTIVE:   VITAL SIGNS: Pulse Rate:  [77-122] 85 (11/15 1915) Resp:  [14-26] 14 (11/15 1915) BP: (155-185)/(69-148) 167/81 mmHg (11/15 1915) SpO2:  [86 %-100 %] 100 % (11/15 1915) FiO2 (%):  [100 %] 100 % (11/15 1715) Weight:  [74.8 kg (164 lb 14.5 oz)] 74.8 kg (164 lb 14.5 oz) (11/15 1604) HEMODYNAMICS:   VENTILATOR SETTINGS: Vent Mode:  [-] PRVC FiO2 (%):  [100 %] 100 % Set Rate:  [14 bmp] 14 bmp Vt Set:  [600 mL] 600 mL PEEP:  [5 cmH20] 5 cmH20 Plateau Pressure:  [16 cmH20] 16 cmH20 INTAKE / OUTPUT: Intake/Output      11/15 0701 - 11/16 0700   Urine (mL/kg/hr) 250   Total Output 250   Net -250         PHYSICAL EXAMINATION: General: Adult male, in NAD. Neuro: Non-responsive despite no sedation. HEENT: [place]/AT. PERRL, sclerae anicteric.  ETT in place. Cardiovascular: RRR, 4/6 SEM. Lungs:  Respirations even and unlabored.  Coarse bilaterally. Abdomen: BS x 4, soft, NT/ND.  Musculoskeletal: No gross deformities, no edema.  Skin: Intact, warm, no rashes.  LABS:  CBC  Recent Labs Lab 05/03/2015 1527 05/03/2015 1533  WBC 18.2*  --   HGB 12.3* 15.0  HCT 38.8* 44.0  PLT 414*  --    Coag's  Recent Labs Lab 04/18/2015 1527  APTT 32  INR 1.20   BMET  Recent Labs Lab 04/27/2015 1527 05/13/2015 1533  NA 137 141  K 3.4* 3.4*  CL 104 103  CO2 24  --   BUN 20 21*  CREATININE 1.67* 1.50*  GLUCOSE 124* 121*   Electrolytes  Recent Labs Lab 04/19/2015 1527  CALCIUM 9.5   Sepsis Markers No results for input(s): LATICACIDVEN, PROCALCITON, O2SATVEN in the last 168 hours. ABG  Recent Labs Lab 04/28/2015 1739  PHART 7.390  PCO2ART 38.7  PO2ART 262.0*   Liver Enzymes  Recent Labs Lab 2014-10-21 1527  AST 19  ALT 10*  ALKPHOS 56  BILITOT 0.7  ALBUMIN 3.7   Cardiac Enzymes No results for input(s): TROPONINI, PROBNP in the last 168 hours. Glucose  Recent Labs Lab 2014-10-21 1544  GLUCAP 148*    Imaging Ct Angio Head W/cm &/or Wo Cm  12/12/14  CLINICAL DATA:  Left-sided weakness. Prior strokes. History of diabetes. EXAM: CT ANGIOGRAPHY HEAD AND NECK TECHNIQUE: Multidetector CT imaging of the head and neck was performed using the standard protocol during bolus administration of intravenous contrast. Multiplanar CT image reconstructions and MIPs were obtained to evaluate the vascular anatomy. Carotid stenosis measurements (when applicable) are obtained utilizing NASCET criteria, using the distal internal carotid diameter as the denominator. CONTRAST:  100 mL Omnipaque 350 COMPARISON:  Head CT earlier today.  Head MRI/ MRA 07/10/2008. FINDINGS: CTA NECK Aortic arch: 3 vessel aortic arch with advanced atherosclerotic plaque. Brachiocephalic and right subclavian arteries are patent with non stenotic plaque. There is approximately 50% stenosis of the distal left  subclavian artery. Right carotid system: Common carotid artery is patent without proximal stenosis. There is extensive calcified and noncalcified plaque about the carotid bifurcation resulting in high-grade stenosis (radiographic string sign) of the proximal internal and distal common carotid arteries. There is also a high-grade stenosis of the external carotid artery origin. Left carotid system: Common carotid artery is patent with calcified and noncalcified plaque throughout its course resulting in less than 50% narrowing of its midportion. Heavily calcified plaque about the carotid bifurcation results in less than 50% narrowing of the proximal internal carotid artery. Vertebral arteries: The vertebral arteries are patent with the right being mildly dominant. There is mild stenosis of the right vertebral artery at its origin. There is a severe stenosis of the mid to distal left V1 segment, with mild narrowing noted of the proximal left V2 segment. Skeleton: Moderate to severe cervical disc degeneration from C3-4 to C5-6. Other neck: Centrilobular emphysema with partially visualized ground-glass opacities and interlobular septal thickening compatible with pulmonary edema described on chest radiograph earlier today. Dependent opacities in the right upper lobe and superior segments of the lower lobes may represent atelectasis but are incompletely evaluated. Endotracheal and enteric tubes are noted, with the enteric tube partially looped posteriorly in the oropharynx before continuing inferiorly. A small amount of venous gas in the supraclavicular regions is likely related to venipuncture. There is a small amount of fluid in the oropharynx and nasopharynx. CTA HEAD Anterior circulation: The internal carotid arteries are patent from skullbase to carotid termini. There is a moderate to severe stenosis of the proximal right cavernous ICA with moderate narrowing involving the anterior genu and proximal supraclinoid ICA on  the right. There is moderate stenosis of the proximal left cavernous ICA. The right A1 segment is absent. Left A1 is widely patent. M1 segments and MCA bifurcations are widely patent. There is mild-to-moderate MCA branch vessel irregularity and attenuation bilaterally. No intracranial aneurysm is identified. Posterior circulation: Intracranial internal carotid arteries are patent with the right being mildly dominant and with mild atherosclerotic irregularity bilaterally without significant stenosis. The left PICA origin is patent. Right PICA is not well seen. SCA origins are patent. Basilar artery is patent without stenosis. Posterior communicating arteries are not identified. The PCAs are patent with moderate branch vessel irregularity and narrowing bilaterally but no significant proximal stenosis. Venous sinuses: Inadequately evaluated due to arterial phase contrast timing and lack of delayed  phase imaging. Anatomic variants: Absent right A1 segment. IMPRESSION: 1. No medium or large vessel intracranial arterial occlusion. 2. Moderate to severe right and moderate left cavernous carotid stenoses. 3. High-grade stenosis (radiographic string sign) of the right carotid bulb. 4. No significant left-sided cervical carotid artery stenosis. 5. Severe proximal left vertebral artery stenosis. These results were called by telephone at the time of interpretation on 04/30/2015 at 6:03 pm to Dr. Lavon Paganini, who verbally acknowledged these results. Electronically Signed   By: Sebastian Ache M.D.   On: 04/24/2015 18:09   Dg Abd 1 View  04/21/2015  CLINICAL DATA:  Code stroke, hypertension, hyperlipidemia, diabetes mellitus, coronary disease post MI EXAM: ABDOMEN - 1 VIEW COMPARISON:  Portable exam 1605 hours compared to CT abdomen and pelvis 07/12/2009 FINDINGS: Penile prosthesis. Scattered atherosclerotic calcifications. Increased stool in rectum. Nonobstructive bowel gas pattern. No bowel dilatation or bowel wall thickening.  Diffuse osseous demineralization. No definite urinary tract calcification. Surgical clips RIGHT upper quadrant from cholecystectomy. IMPRESSION: Increased stool in rectum. Nonobstructive bowel gas pattern. Electronically Signed   By: Ulyses Southward M.D.   On: 05/01/2015 16:37   Ct Head Wo Contrast  04/27/2015  ADDENDUM REPORT: 04/23/2015 16:08 ADDENDUM: The original report was by Dr. Gaylyn Rong. The following addendum is by Dr. Gaylyn Rong: These results were discussed by telephone at the time of interpretation on 05/01/2015 at 3:59 pm to Dr. Melene Plan , who verbally acknowledged these results. Electronically Signed   By: Gaylyn Rong M.D.   On: 05/13/2015 16:08  04/20/2015  CLINICAL DATA:  Code stroke. History of remote strokes. Unable to move left hand. EXAM: CT HEAD WITHOUT CONTRAST TECHNIQUE: Contiguous axial images were obtained from the base of the skull through the vertex without intravenous contrast. COMPARISON:  07/10/2008 FINDINGS: Remote bilateral cerebellar lacunar infarcts. Right upper cerebellar remote infarct, likewise stable from 2010. Larger left parietal lobe infarct is chronic and similar to 2010. Scattered small lacunar infarcts in the basal ganglia and external capsules. Periventricular white matter and corona radiata hypodensities favor chronic ischemic microvascular white matter disease. Remote lacunar infarct in the right thalamus. Remote right frontal lobe infarct, image 24 series 2. No intracranial hemorrhage, mass lesion, or acute CVA. Chronic bilateral maxillary sinusitis. There is atherosclerotic calcification of the cavernous carotid arteries bilaterally. IMPRESSION: 1. Remote infarcts in the left parietal lobe and right frontal lobe with scattered remote lacunar infarcts, but no acute intracranial findings are identified on today's CT scan. 2. Periventricular white matter and corona radiata hypodensities favor chronic ischemic microvascular white matter disease.  3. Chronic bilateral maxillary sinusitis. Electronically Signed: By: Gaylyn Rong M.D. On: 05/01/2015 15:49   Ct Angio Neck W/cm &/or Wo/cm  04/27/2015  CLINICAL DATA:  Left-sided weakness. Prior strokes. History of diabetes. EXAM: CT ANGIOGRAPHY HEAD AND NECK TECHNIQUE: Multidetector CT imaging of the head and neck was performed using the standard protocol during bolus administration of intravenous contrast. Multiplanar CT image reconstructions and MIPs were obtained to evaluate the vascular anatomy. Carotid stenosis measurements (when applicable) are obtained utilizing NASCET criteria, using the distal internal carotid diameter as the denominator. CONTRAST:  100 mL Omnipaque 350 COMPARISON:  Head CT earlier today.  Head MRI/ MRA 07/10/2008. FINDINGS: CTA NECK Aortic arch: 3 vessel aortic arch with advanced atherosclerotic plaque. Brachiocephalic and right subclavian arteries are patent with non stenotic plaque. There is approximately 50% stenosis of the distal left subclavian artery. Right carotid system: Common carotid artery is patent without proximal stenosis. There is  extensive calcified and noncalcified plaque about the carotid bifurcation resulting in high-grade stenosis (radiographic string sign) of the proximal internal and distal common carotid arteries. There is also a high-grade stenosis of the external carotid artery origin. Left carotid system: Common carotid artery is patent with calcified and noncalcified plaque throughout its course resulting in less than 50% narrowing of its midportion. Heavily calcified plaque about the carotid bifurcation results in less than 50% narrowing of the proximal internal carotid artery. Vertebral arteries: The vertebral arteries are patent with the right being mildly dominant. There is mild stenosis of the right vertebral artery at its origin. There is a severe stenosis of the mid to distal left V1 segment, with mild narrowing noted of the proximal left V2  segment. Skeleton: Moderate to severe cervical disc degeneration from C3-4 to C5-6. Other neck: Centrilobular emphysema with partially visualized ground-glass opacities and interlobular septal thickening compatible with pulmonary edema described on chest radiograph earlier today. Dependent opacities in the right upper lobe and superior segments of the lower lobes may represent atelectasis but are incompletely evaluated. Endotracheal and enteric tubes are noted, with the enteric tube partially looped posteriorly in the oropharynx before continuing inferiorly. A small amount of venous gas in the supraclavicular regions is likely related to venipuncture. There is a small amount of fluid in the oropharynx and nasopharynx. CTA HEAD Anterior circulation: The internal carotid arteries are patent from skullbase to carotid termini. There is a moderate to severe stenosis of the proximal right cavernous ICA with moderate narrowing involving the anterior genu and proximal supraclinoid ICA on the right. There is moderate stenosis of the proximal left cavernous ICA. The right A1 segment is absent. Left A1 is widely patent. M1 segments and MCA bifurcations are widely patent. There is mild-to-moderate MCA branch vessel irregularity and attenuation bilaterally. No intracranial aneurysm is identified. Posterior circulation: Intracranial internal carotid arteries are patent with the right being mildly dominant and with mild atherosclerotic irregularity bilaterally without significant stenosis. The left PICA origin is patent. Right PICA is not well seen. SCA origins are patent. Basilar artery is patent without stenosis. Posterior communicating arteries are not identified. The PCAs are patent with moderate branch vessel irregularity and narrowing bilaterally but no significant proximal stenosis. Venous sinuses: Inadequately evaluated due to arterial phase contrast timing and lack of delayed phase imaging. Anatomic variants: Absent right  A1 segment. IMPRESSION: 1. No medium or large vessel intracranial arterial occlusion. 2. Moderate to severe right and moderate left cavernous carotid stenoses. 3. High-grade stenosis (radiographic string sign) of the right carotid bulb. 4. No significant left-sided cervical carotid artery stenosis. 5. Severe proximal left vertebral artery stenosis. These results were called by telephone at the time of interpretation on 04/25/2015 at 6:03 pm to Dr. Lavon Paganini, who verbally acknowledged these results. Electronically Signed   By: Sebastian Ache M.D.   On: 05/09/2015 18:09   Dg Chest Portable 1 View  04/24/2015  CLINICAL DATA:  Endotracheal tube placement. Hypertension. Acute myocardial infarction. Coronary artery disease. EXAM: PORTABLE CHEST 1 VIEW COMPARISON:  05/07/2015 at 4:01 p.m. FINDINGS: Newly placed endotracheal tube tip 3.6 cm above the carina, well positioned. Nasogastric tube tip: Stomach body. Atherosclerotic aortic arch and descending thoracic aorta. Mild to moderate enlargement of the cardiopericardial silhouette with increasing bilateral interstitial accentuation compatible with acute interstitial pulmonary edema. IMPRESSION: 1. Endotracheal tube well positioned, tip 3.6 cm above the carina. Nasogastric tube tip: Stomach body. 2. Moderate enlargement of the cardiopericardial silhouette with acute interstitial pulmonary edema.  This is mildly worsened since the prior radiograph from 37 minutes before. Electronically Signed   By: Gaylyn Rong M.D.   On: 04/30/2015 16:51   Dg Chest Port 1 View  05/11/2015  CLINICAL DATA:  Code stroke, unresponsive, hypertension, diabetes mellitus, hyperlipidemia, coronary artery disease post MI, former smoker EXAM: PORTABLE CHEST 1 VIEW COMPARISON:  Portable exam 1601 hours compared to 04/10/2015 FINDINGS: Enlargement of cardiac silhouette. Mediastinal contours and pulmonary vascularity normal. Atherosclerotic calcification and tortuosity of thoracic aorta. Patchy  RIGHT perihilar infiltrate with probable mild residual infiltrate at LEFT base as well. Overall slightly improved aeration versus previous exam. No pneumothorax or gross pleural effusion. Bones demineralized. IMPRESSION: Enlargement of cardiac silhouette. Slightly improved BILATERAL pulmonary infiltrates since 04/10/2015. Electronically Signed   By: Ulyses Southward M.D.   On: 05/03/2015 16:36     ASSESSMENT / PLAN:  NEUROLOGIC A:   Acute encephalopathy likely due to acute CVA - initial head CT / CTA negative for acute occlusion.  Concern that he has not been responsive since intubation which was almost 3 hours ago. ? Seizures - EEG done and results pending. P:   Sedation:  Fentanyl PRN / Midazolam PRN. RASS goal: 0 to -1. Daily WUA. Neuro following. Assess ammonia, TSH, B12, folate.  PULMONARY OETT 11/15 >>> A: Acute respiratory failure due to inability to protect airway. Pulmonary edema. P:   Full mechanical support, wean as able. VAP prevention measures. SBT in AM if able. Lasix 40mg  x 1. CXR in AM.  CARDIOVASCULAR A:  Hx HTN, HLD, HOCM, CAD, MI. P:  Continue outpatient plavix, lovastain. Hold outpatient amlodipine, HCTZ, imdur, lopressor, nitro, ramipril.  RENAL A:   Hypokalemia. CKD. P:   Potassium x 1. NS @ 75. BMP in AM.  GASTROINTESTINAL A:   GI prophylaxis. Nutrition. P:   SUP: Pantoprazole. NPO.  HEMATOLOGIC A:  VTE Prophylaxis. P:  SCD's / Heparin. CBC in AM.  INFECTIOUS A:    Leukocytosis + hypothermia - no clear source of infection at this point. P:   BCx2 11/15 > UCx 11/15 > Abx: Vanc, start date 11/15, day 1/x. Abx: Zosyn, start date 11/15, day 1/x. PCT algorithm to limit abx exposure.  ENDOCRINE A:   DM. P:   SSI. Hold outpatient NPH, lantus.   Family updated: Son and Daughter at bedside.  Interdisciplinary Family Meeting v Palliative Care Meeting:  Due by: 11/21.   Rutherford Guys, [place] [facility] [place] [facility] Pager: [phone]  or [phone] 05/06/2015, 7:34 PM   Attending:  I have seen and examined the patient with nurse practitioner/resident and agree with the note above.   [name] was intubated on my exam but I spoke to his daughter tonight.  She tells me he was in his usual state of health today but suddenly became unresponsive.  Was last normal, speaking around 11:35 AM per family, then noted to be unresponsive at 14:08.  In the ER his respiratory status declined and he was intubated.    On exam:  Unresponsive on vent, has vent supported breaths, respirations clear to auscultation; his CV exam is significant for a significant systolic cardiac murmur  CT angiogram shows bilateral carotid artery disease but no vascular obstruction and no sign of stroke  Labs essentially normal but U/A notable for concentrated urine  CXR with bilateral pulmonary edema  Acute encephalopathy> I am concerned about global neurologic injury considering the sudden onset of symptoms,  lack of response.  His HOCM + bilateral carotid disease would be a set up for this. DDx includes seizure activity vs basilar or posterior circulation occlusion not seen on CT?  At this point there is no clear metabolic derrangement which would explain this.  Could he have had a transient arrhythmia that lead to this? He really needs an MRI brain but he has metal dental implants> his daughter will call the dentist to figure out what kind they are Acute respiratory failure with hypoxemia> some pulmonary edema on exam, likely CHF exacerbation Plan  MRI when able Vent support overnight Minimize sedation Tele monitoring PRN BP control Hold diuresis for now Repeat CXR in AM Echocardiogram Stroke work up and ordersets per neuro  My cc time tonight 40 minutes  Heber Kemper, MD [facility] Pager: [phone] Cell: [phone] After 3pm or if no response, call [phone]

## 2015-05-03 ENCOUNTER — Inpatient Hospital Stay (HOSPITAL_COMMUNITY): Payer: Medicare Other

## 2015-05-03 ENCOUNTER — Ambulatory Visit (HOSPITAL_COMMUNITY): Payer: Medicare Other

## 2015-05-03 ENCOUNTER — Encounter (HOSPITAL_COMMUNITY): Payer: Self-pay | Admitting: *Deleted

## 2015-05-03 DIAGNOSIS — I6521 Occlusion and stenosis of right carotid artery: Secondary | ICD-10-CM

## 2015-05-03 DIAGNOSIS — J9601 Acute respiratory failure with hypoxia: Secondary | ICD-10-CM

## 2015-05-03 DIAGNOSIS — I6789 Other cerebrovascular disease: Secondary | ICD-10-CM

## 2015-05-03 LAB — TROPONIN I
TROPONIN I: 0.06 ng/mL — AB (ref <0.031)
TROPONIN I: 0.07 ng/mL — AB (ref <0.031)

## 2015-05-03 LAB — CBC
HEMATOCRIT: 38.6 % — AB (ref 39.0–52.0)
HEMOGLOBIN: 12.3 g/dL — AB (ref 13.0–17.0)
MCH: 26.6 pg (ref 26.0–34.0)
MCHC: 31.9 g/dL (ref 30.0–36.0)
MCV: 83.4 fL (ref 78.0–100.0)
PLATELETS: 387 10*3/uL (ref 150–400)
RBC: 4.63 MIL/uL (ref 4.22–5.81)
RDW: 17.4 % — ABNORMAL HIGH (ref 11.5–15.5)
WBC: 20.7 10*3/uL — ABNORMAL HIGH (ref 4.0–10.5)

## 2015-05-03 LAB — BLOOD GAS, ARTERIAL
ACID-BASE DEFICIT: 1.8 mmol/L (ref 0.0–2.0)
Bicarbonate: 21.7 mEq/L (ref 20.0–24.0)
DRAWN BY: 426241
FIO2: 0.6
MECHVT: 550 mL
O2 SAT: 98.9 %
PATIENT TEMPERATURE: 98.6
PEEP/CPAP: 5 cmH2O
PH ART: 7.445 (ref 7.350–7.450)
PO2 ART: 139 mmHg — AB (ref 80.0–100.0)
RATE: 14 resp/min
TCO2: 22.7 mmol/L (ref 0–100)
pCO2 arterial: 32.1 mmHg — ABNORMAL LOW (ref 35.0–45.0)

## 2015-05-03 LAB — BASIC METABOLIC PANEL
Anion gap: 10 (ref 5–15)
BUN: 18 mg/dL (ref 6–20)
CHLORIDE: 106 mmol/L (ref 101–111)
CO2: 24 mmol/L (ref 22–32)
Calcium: 9.1 mg/dL (ref 8.9–10.3)
Creatinine, Ser: 1.57 mg/dL — ABNORMAL HIGH (ref 0.61–1.24)
GFR calc Af Amer: 46 mL/min — ABNORMAL LOW (ref >60)
GFR calc non Af Amer: 39 mL/min — ABNORMAL LOW (ref >60)
GLUCOSE: 149 mg/dL — AB (ref 65–99)
POTASSIUM: 4.3 mmol/L (ref 3.5–5.1)
Sodium: 140 mmol/L (ref 135–145)

## 2015-05-03 LAB — MAGNESIUM: MAGNESIUM: 1.8 mg/dL (ref 1.7–2.4)

## 2015-05-03 LAB — GLUCOSE, CAPILLARY
GLUCOSE-CAPILLARY: 119 mg/dL — AB (ref 65–99)
GLUCOSE-CAPILLARY: 231 mg/dL — AB (ref 65–99)
Glucose-Capillary: 238 mg/dL — ABNORMAL HIGH (ref 65–99)
Glucose-Capillary: 121 mg/dL — ABNORMAL HIGH (ref 65–99)
Glucose-Capillary: 158 mg/dL — ABNORMAL HIGH (ref 65–99)
Glucose-Capillary: 139 mg/dL — ABNORMAL HIGH (ref 65–99)
Glucose-Capillary: 198 mg/dL — ABNORMAL HIGH (ref 65–99)

## 2015-05-03 LAB — PHOSPHORUS: Phosphorus: 2.1 mg/dL — ABNORMAL LOW (ref 2.5–4.6)

## 2015-05-03 LAB — PROCALCITONIN: PROCALCITONIN: 0.64 ng/mL

## 2015-05-03 MED ORDER — IOHEXOL 350 MG/ML SOLN
40.0000 mL | Freq: Once | INTRAVENOUS | Status: AC | PRN
  Administered 2015-05-03: 40 mL via INTRAVENOUS

## 2015-05-03 MED ORDER — VITAL HIGH PROTEIN PO LIQD
1000.0000 mL | ORAL | Status: DC
  Filled 2015-05-03 (×2): qty 1000

## 2015-05-03 MED ORDER — SODIUM CHLORIDE 0.9 % IV SOLN
250.0000 mg | Freq: Two times a day (BID) | INTRAVENOUS | Status: DC
  Administered 2015-05-03 (×2): 250 mg via INTRAVENOUS
  Filled 2015-05-03 (×4): qty 2.5

## 2015-05-03 MED ORDER — VITAL AF 1.2 CAL PO LIQD
1000.0000 mL | ORAL | Status: DC
  Administered 2015-05-03: 1000 mL
  Filled 2015-05-03 (×4): qty 1000

## 2015-05-03 MED ORDER — PANTOPRAZOLE SODIUM 40 MG PO PACK
40.0000 mg | PACK | Freq: Every day | ORAL | Status: DC
  Administered 2015-05-03: 40 mg
  Filled 2015-05-03: qty 20

## 2015-05-03 MED ORDER — MAGNESIUM SULFATE IN D5W 10-5 MG/ML-% IV SOLN
1.0000 g | Freq: Once | INTRAVENOUS | Status: AC
  Administered 2015-05-03: 1 g via INTRAVENOUS
  Filled 2015-05-03: qty 100

## 2015-05-03 NOTE — Progress Notes (Signed)
Paged Dr. Vassie LollAlva regarding pt.'s continuous bundle branch.  Ordered Stat EKG.  Results called and given to Dr. Jamison NeighborNestor.  Orders for troponin were given.  Will continue to monitor patient.

## 2015-05-03 NOTE — Consult Note (Signed)
[facility]  Patient name: [name] MRN: [id number] DOB: [birth year] Sex: male  REASON FOR CONSULT: high grade right carotid stenosis  The patient is intubated and no family is available at the bedside. His history was obtained from the chart.   HPI: [name] is a 81 y.o. male, who is presented to the [facility] ED on 11/15 for AMS. The patient was with his wife yesterday morning around 1030 and watched TV from 1030-1330. He did not talk to anybody at this time. At 1330, he was noted to be slumped over and non-responsive. His left arm was found to be in flexion, left eye deviation and right hemianopsia. A code stroke was called. An MRI was not performed due to penile implant and dental implants. CT brain revealed chronic bilateral MCA infarcts.  A CTA head/neck revealed high grade right proximal right ICA stenosis >90%.A stat EEG was done at the bedside revealing no epileptiform activity.   He was intubated for airway protection and transferred to the neuro ICU.  He has a history of remote stroke with residual right hand weakness. He has a PMH of hypertension, hyperlipidemia, CAD, HOCM, type 2 diabetes.   Past Medical History  Diagnosis Date  . Hypertension   . Hyperlipidemia   . Diabetes mellitus     type 2  . Cerebrovascular accident [facility])     Old  . Hypertrophic obstructive cardiomyopathy     hystory of   . Peripheral edema   . Hypokalemia   . Acute inferolateral myocardial infarction (HCC) 12/23/99  . Coronary artery disease     cardiac catheterization in 2001 - Successful PTCA and stenting of the mid right coronary artery -- Well preserved left ventricular systolic function -- severe three-vessel coronary artery disease including a severe disease involving the right coronary artery     Family History  Problem Relation Age of Onset  . Arthritis Mother   . Heart disease Mother   . Hypertension Mother   . Diabetes Mother   .  Heart disease Brother   . Diabetes Brother     SOCIAL HISTORY: Social History   Social History  . Marital Status: Single    Spouse Name: N/A  . Number of Children: N/A  . Years of Education: N/A   Occupational History  . Not on file.   Social History Main Topics  . Smoking status: Former Smoker -- 1.00 packs/day for 45 years    Types: Cigarettes    Quit date: 06/17/1993  . Smokeless tobacco: Not on file  . Alcohol Use: No  . Drug Use: No  . Sexual Activity: Not on file   Other Topics Concern  . Not on file   Social History Narrative    Allergies  Allergen Reactions  . Zyban [Bupropion Hcl]     unknown    Current Facility-Administered Medications  Medication Dose Route Frequency Provider Last Rate Last Dose  . 0.9 %  sodium chloride infusion   Intravenous Continuous Rahul P Desai, PA-C 75 mL/hr at May 22, 2015 2055    . 0.9 %  sodium chloride infusion  250 mL Intravenous PRN Rahul P Desai, PA-C      . antiseptic oral rinse solution (CORINZ)  7 mL Mouth Rinse 10 times per day Lupita Leash, MD   7 mL at 05/03/15 0955  . chlorhexidine gluconate (PERIDEX) 0.12 % solution 15 mL  15 mL Mouth Rinse BID Lupita Leash, MD  15 mL at 05/03/15 0755  . clopidogrel (PLAVIX) tablet 75 mg  75 mg Per Tube Daily Rahul P Desai, PA-C   75 mg at 05/03/15 0952  . feeding supplement (VITAL HIGH PROTEIN) liquid 1,000 mL  1,000 mL Per Tube Q24H Cyril Mourningakesh Alva V, MD      . fentaNYL (SUBLIMAZE) injection 50 mcg  50 mcg Intravenous Q15 min PRN Rahul P Desai, PA-C   50 mcg at 09-21-14 2234  . fentaNYL (SUBLIMAZE) injection 50 mcg  50 mcg Intravenous Q2H PRN Rahul P Desai, PA-C      . heparin injection 5,000 Units  5,000 Units Subcutaneous 3 times per day Rahul Astrid DraftsP Desai, PA-C   5,000 Units at 05/03/15 0618  . hydrALAZINE (APRESOLINE) injection 10-40 mg  10-40 mg Intravenous Q4H PRN Lupita Leashouglas B McQuaid, MD      . insulin aspart (novoLOG) injection 0-15 Units  0-15 Units Subcutaneous 6 times per day  Rahul Astrid DraftsP Desai, PA-C   2 Units at 05/03/15 0441  . labetalol (NORMODYNE,TRANDATE) injection 20 mg  20 mg Intravenous Q10 min PRN Lupita Leashouglas B McQuaid, MD      . levETIRAcetam (KEPPRA) 250 mg in sodium chloride 0.9 % 100 mL IVPB  250 mg Intravenous Q12H Ulice Dashavid R Smith, PA-C      . midazolam (VERSED) injection 1 mg  1 mg Intravenous Q15 min PRN Rahul P Desai, PA-C      . midazolam (VERSED) injection 1 mg  1 mg Intravenous Q2H PRN Rahul P Desai, PA-C      . pantoprazole sodium (PROTONIX) 40 mg/20 mL oral suspension 40 mg  40 mg Per Tube QHS Cyril Mourningakesh Alva V, MD      . pravastatin (PRAVACHOL) tablet 10 mg  10 mg Per Tube q1800 Rahul P Desai, PA-C        REVIEW OF SYSTEMS:  ROS not performed due to patient being intubated.   [X]  denotes positive finding, [ ]  denotes negative finding Cardiac  Comments:  Chest pain or chest pressure:    Shortness of breath upon exertion:    Short of breath when lying flat:    Irregular heart rhythm:        Vascular    Pain in calf, thigh, or hip brought on by ambulation:    Pain in feet at night that wakes you up from your sleep:     Blood clot in your veins:    Leg swelling:         Pulmonary    Oxygen at home:    Productive cough:     Wheezing:         Neurologic    Sudden weakness in arms or legs:     Sudden numbness in arms or legs:     Sudden onset of difficulty speaking or slurred speech:    Temporary loss of vision in one eye:     Problems with dizziness:         Gastrointestinal    Blood in stool:     Vomited blood:         Genitourinary    Burning when urinating:     Blood in urine:        Psychiatric    Major depression:         Hematologic    Bleeding problems:    Problems with blood clotting too easily:        Skin    Rashes or ulcers:  Constitutional    Fever or chills:      PHYSICAL EXAM: Filed Vitals:   05/03/15 0700 05/03/15 0800 05/03/15 0830 05/03/15 0900  BP: 145/60 135/62 135/62 137/62  Pulse: 86 87 93 86    Temp: 99.3 F (37.4 C) 99.1 F (37.3 C)  99.3 F (37.4 C)  TempSrc:      Resp: Height:      Weight:      SpO2: 100% 100% 100% 99%    GENERAL: The patient is ill appearing. Not sedated, but unresponsive on the ventilator. His eyes very slightly opened when I said his name. Otherwise, did not follow any commands.  CARDIAC: Irregular rhythm. Systolic ejection murmur at aortic area.  VASCULAR: 2+ radial pulses b/l, palpable right femoral pulse. Non palpable left femoral pulse. Non palpable pedal pulses, but feet appear well perfused.  PULMONARY: Respirations even on vent.  MUSCULOSKELETAL: There are no major deformities or cyanosis.  NEUROLOGIC: unable to examine SKIN: There are no ulcers or rashes noted.   DATA:  CTA Neck/Head 04/27/2015 IMPRESSION: 1. No medium or large vessel intracranial arterial occlusion. 2. Moderate to severe right and moderate left cavernous carotid stenoses. 3. High-grade stenosis (radiographic string sign) of the right carotid bulb. 4. No significant left-sided cervical carotid artery stenosis. 5. Severe proximal left vertebral artery stenosis.  CT Head 05/06/2015 IMPRESSION: 1. Remote infarcts in the left parietal lobe and right frontal lobe with scattered remote lacunar infarcts, but no acute intracranial findings are identified on today's CT scan. 2. Periventricular white matter and corona radiata hypodensities favor chronic ischemic microvascular white matter disease. 3. Chronic bilateral maxillary sinusitis.   MEDICAL ISSUES:  High grade right proximal ICA stenosis (>90%) seen on CTA No plans for surgical intervention anytime soon given patient's current medical state. Will need cardiac clearance prior to any intervention. We will follow along.   Acute encephalopathy possibly secondary to acute CVA MRI not performed due to presence of dental implants and penile implants. Family to inquire about what kind of implants the patient  has and whether they are MRI compatible.  CT head revealing chronic b/l MCA infarcts.  EEG revealing no epileptiform or seizure activity. The patient has been unresponsive despite not being sedated.   Acute respiratory failuredue to inability to protect airway Acute pulmonary edema Management per PCCM.   Leukocytosis of unclear etiology Cultures pending.  On empiric abx.   Dr. Hart [place] to evaluate patient and make further recommendations.   Raymond Gurney, PA-C [facility] [place] Pager: [phone] Patient appears to have had fairly dense CVA Does not respond to commands-occasionally moves spontaneously CT angiogram reviewed and he does have very severe right ICA stenosis He has not candidate for any surgery or intervention on his recurrent right carotid unless he gets significant return of function If that occurs we would evaluate the patient 6 weeks from now to see if he is candidate for any intervention

## 2015-05-03 NOTE — Procedures (Signed)
History: 81 yo M presenting with concern for seizure  Sedation: Lorazepam 0.5mg  , midazolam 2mg   Technique: This is a 19 channel routine scalp EEG performed at the bedside with bipolar and monopolar montages arranged in accordance to the international 10/20 system of electrode placement. One channel was dedicated to EKG recording.    Background: There is generalized irregular  delta and theta activity intermixed with a frontally predomainant beta range activity. There are occasional sleep spindles seen intermixed with this other activity. No epileptiform or evolving activity was observed.   Photic stimulation: Physiologic driving is not performed  EEG Abnormalities: 1) Generalized irregular slow acitivity  Clinical Interpretation: This EEG is consistent with a generalized non-specific cerebral dysfunction(encephalopathy). There was no seizure or seizure predisposition recorded on this study.   Ritta SlotMcNeill Yoshiharu Brassell, MD [facility] [phone]  If 7pm- 7am, please page neurology on call as listed in AMION.

## 2015-05-03 NOTE — Progress Notes (Signed)
Pt transported to CT, no complications, placed back on PS trial when returned back to room.

## 2015-05-03 NOTE — Progress Notes (Signed)
  Echocardiogram 2D Echocardiogram has been performed.  [name] Show 05/03/2015, 9:43 AM

## 2015-05-03 NOTE — Progress Notes (Signed)
PULMONARY / CRITICAL CARE MEDICINE   Name: KDEN WAGSTER MRN: [id number] DOB: Oct 30, 1930    ADMISSION DATE:  05-10-15 CONSULTATION DATE:  05-10-15  REFERRING MD :  EDP  CHIEF COMPLAINT:  AMS  INITIAL PRESENTATION:  81 y.o. male brought to [facility] ED 11/15 for AMS.  Code stroke was called in ED; CT and CTA were negative for acute infarct.however, there was also concern for seizures.  He was intubated for airway protection and PCCM called for admission.  he has hx of remote stroke with residual right hand weakness  STUDIES:  CT / CTA head 11/15 >>> no medium or large vessel occlusion. CXR 11/15 >>> ETT in place.  Moderate enlargement of cardiac silhouette with acute interstitial pulmonary edema. EEG 11/15 >>>  SIGNIFICANT EVENTS: 11/15 - admitted with AMS and concern for CVA. 10/24- 04/12/15 recent admission  for CAP (treated with rocephin / azithro and discharged on 5 days augmentin).   SUBJECTIVE: afebrile Remains unresponsive No seizure activity  VITAL SIGNS: Temp:  [95.3 F (35.2 C)-99.9 F (37.7 C)] 99.1 F (37.3 C) (11/16 0800) Pulse Rate:  [72-122] 93 (11/16 0830) Resp:  [14-30] 14 (11/16 0830) BP: (73-199)/(50-148) 135/62 mmHg (11/16 0830) SpO2:  [86 %-100 %] 100 % (11/16 0830) FiO2 (%):  [40 %-100 %] 40 % (11/16 0830) Weight:  [73.9 kg (162 lb 14.7 oz)-74.8 kg (164 lb 14.5 oz)] 73.9 kg (162 lb 14.7 oz) (11/16 0500) HEMODYNAMICS:   VENTILATOR SETTINGS: Vent Mode:  [-] PRVC FiO2 (%):  [40 %-100 %] 40 % Set Rate:  [14 bmp] 14 bmp Vt Set:  [550 mL-600 mL] 550 mL PEEP:  [5 cmH20] 5 cmH20 Plateau Pressure:  [16 cmH20-20 cmH20] 17 cmH20 INTAKE / OUTPUT: Intake/Output      11/15 0701 - 11/16 0700 11/16 0701 - 11/17 0700   I.V. (mL/kg) 825 (11.2) 75 (1)   Total Intake(mL/kg) 825 (11.2) 75 (1)   Urine (mL/kg/hr) 2750    Stool 0    Total Output 2750     Net -1925 +75        Stool Occurrence 2 x      PHYSICAL EXAMINATION: General: Adult male, in NAD. Neuro:  Non-responsive despite no sedation, localises LUE, extensor BLEs. HEENT: [place]/AT. PERRL, sclerae anicteric.  ETT in place. Cardiovascular: RRR, 4/6 SEM. Lungs: Respirations even and unlabored.  Coarse bilaterally. Abdomen: BS x 4, soft, NT/ND.  Musculoskeletal: No gross deformities, no edema.  Skin: Intact, warm, no rashes.  LABS:  CBC  Recent Labs Lab 05-10-15 1527 05/10/2015 1533 05/03/15 0434  WBC 18.2*  --  20.7*  HGB 12.3* 15.0 12.3*  HCT 38.8* 44.0 38.6*  PLT 414*  --  387   Coag's  Recent Labs Lab 05-10-2015 1527  APTT 32  INR 1.20   BMET  Recent Labs Lab 10-May-2015 1527 May 10, 2015 1533 05/03/15 0434  NA 137 141 140  K 3.4* 3.4* 4.3  CL 104 103 106  CO2 24  --  24  BUN 20 21* 18  CREATININE 1.67* 1.50* 1.57*  GLUCOSE 124* 121* 149*   Electrolytes  Recent Labs Lab 05-10-15 1527 05/03/15 0434  CALCIUM 9.5 9.1  MG  --  1.8  PHOS  --  2.1*   Sepsis Markers  Recent Labs Lab May 10, 2015 2056 05/03/15 0434  PROCALCITON 0.20 0.64   ABG  Recent Labs Lab 10-May-2015 1739 05/03/15 0451  PHART 7.390 7.445  PCO2ART 38.7 32.1*  PO2ART 262.0* 139*   Liver Enzymes  Recent  Labs Lab 05-20-2015 1527  AST 19  ALT 10*  ALKPHOS 56  BILITOT 0.7  ALBUMIN 3.7   Cardiac Enzymes  Recent Labs Lab 2015/05/20 2012  TROPONINI <0.03   Glucose  Recent Labs Lab 05-20-2015 1544 05-20-2015 2208 05/03/15 0014 05/03/15 0757  GLUCAP 148* 238* 231* 119*    Imaging Ct Angio Head W/cm &/or Wo Cm  05-20-15  CLINICAL DATA:  Left-sided weakness. Prior strokes. History of diabetes. EXAM: CT ANGIOGRAPHY HEAD AND NECK TECHNIQUE: Multidetector CT imaging of the head and neck was performed using the standard protocol during bolus administration of intravenous contrast. Multiplanar CT image reconstructions and MIPs were obtained to evaluate the vascular anatomy. Carotid stenosis measurements (when applicable) are obtained utilizing NASCET criteria, using the distal internal  carotid diameter as the denominator. CONTRAST:  100 mL Omnipaque 350 COMPARISON:  Head CT earlier today.  Head MRI/ MRA 07/10/2008. FINDINGS: CTA NECK Aortic arch: 3 vessel aortic arch with advanced atherosclerotic plaque. Brachiocephalic and right subclavian arteries are patent with non stenotic plaque. There is approximately 50% stenosis of the distal left subclavian artery. Right carotid system: Common carotid artery is patent without proximal stenosis. There is extensive calcified and noncalcified plaque about the carotid bifurcation resulting in high-grade stenosis (radiographic string sign) of the proximal internal and distal common carotid arteries. There is also a high-grade stenosis of the external carotid artery origin. Left carotid system: Common carotid artery is patent with calcified and noncalcified plaque throughout its course resulting in less than 50% narrowing of its midportion. Heavily calcified plaque about the carotid bifurcation results in less than 50% narrowing of the proximal internal carotid artery. Vertebral arteries: The vertebral arteries are patent with the right being mildly dominant. There is mild stenosis of the right vertebral artery at its origin. There is a severe stenosis of the mid to distal left V1 segment, with mild narrowing noted of the proximal left V2 segment. Skeleton: Moderate to severe cervical disc degeneration from C3-4 to C5-6. Other neck: Centrilobular emphysema with partially visualized ground-glass opacities and interlobular septal thickening compatible with pulmonary edema described on chest radiograph earlier today. Dependent opacities in the right upper lobe and superior segments of the lower lobes may represent atelectasis but are incompletely evaluated. Endotracheal and enteric tubes are noted, with the enteric tube partially looped posteriorly in the oropharynx before continuing inferiorly. A small amount of venous gas in the supraclavicular regions is  likely related to venipuncture. There is a small amount of fluid in the oropharynx and nasopharynx. CTA HEAD Anterior circulation: The internal carotid arteries are patent from skullbase to carotid termini. There is a moderate to severe stenosis of the proximal right cavernous ICA with moderate narrowing involving the anterior genu and proximal supraclinoid ICA on the right. There is moderate stenosis of the proximal left cavernous ICA. The right A1 segment is absent. Left A1 is widely patent. M1 segments and MCA bifurcations are widely patent. There is mild-to-moderate MCA branch vessel irregularity and attenuation bilaterally. No intracranial aneurysm is identified. Posterior circulation: Intracranial internal carotid arteries are patent with the right being mildly dominant and with mild atherosclerotic irregularity bilaterally without significant stenosis. The left PICA origin is patent. Right PICA is not well seen. SCA origins are patent. Basilar artery is patent without stenosis. Posterior communicating arteries are not identified. The PCAs are patent with moderate branch vessel irregularity and narrowing bilaterally but no significant proximal stenosis. Venous sinuses: Inadequately evaluated due to arterial phase contrast timing and lack of  delayed phase imaging. Anatomic variants: Absent right A1 segment. IMPRESSION: 1. No medium or large vessel intracranial arterial occlusion. 2. Moderate to severe right and moderate left cavernous carotid stenoses. 3. High-grade stenosis (radiographic string sign) of the right carotid bulb. 4. No significant left-sided cervical carotid artery stenosis. 5. Severe proximal left vertebral artery stenosis. These results were called by telephone at the time of interpretation on 2015-05-28 at 6:03 pm to Dr. Lavon Paganini, who verbally acknowledged these results. Electronically Signed   By: Sebastian Ache M.D.   On: 2015/05/28 18:09   Dg Abd 1 View  05-28-2015  CLINICAL DATA:  Code  stroke, hypertension, hyperlipidemia, diabetes mellitus, coronary disease post MI EXAM: ABDOMEN - 1 VIEW COMPARISON:  Portable exam 1605 hours compared to CT abdomen and pelvis 07/12/2009 FINDINGS: Penile prosthesis. Scattered atherosclerotic calcifications. Increased stool in rectum. Nonobstructive bowel gas pattern. No bowel dilatation or bowel wall thickening. Diffuse osseous demineralization. No definite urinary tract calcification. Surgical clips RIGHT upper quadrant from cholecystectomy. IMPRESSION: Increased stool in rectum. Nonobstructive bowel gas pattern. Electronically Signed   By: Ulyses Southward M.D.   On: May 28, 2015 16:37   Ct Head Wo Contrast  May 28, 2015  ADDENDUM REPORT: 2015-05-28 16:08 ADDENDUM: The original report was by Dr. Gaylyn Rong. The following addendum is by Dr. Gaylyn Rong: These results were discussed by telephone at the time of interpretation on 05/28/15 at 3:59 pm to Dr. Melene Plan , who verbally acknowledged these results. Electronically Signed   By: Gaylyn Rong M.D.   On: 28-May-2015 16:08  May 28, 2015  CLINICAL DATA:  Code stroke. History of remote strokes. Unable to move left hand. EXAM: CT HEAD WITHOUT CONTRAST TECHNIQUE: Contiguous axial images were obtained from the base of the skull through the vertex without intravenous contrast. COMPARISON:  07/10/2008 FINDINGS: Remote bilateral cerebellar lacunar infarcts. Right upper cerebellar remote infarct, likewise stable from 2010. Larger left parietal lobe infarct is chronic and similar to 2010. Scattered small lacunar infarcts in the basal ganglia and external capsules. Periventricular white matter and corona radiata hypodensities favor chronic ischemic microvascular white matter disease. Remote lacunar infarct in the right thalamus. Remote right frontal lobe infarct, image 24 series 2. No intracranial hemorrhage, mass lesion, or acute CVA. Chronic bilateral maxillary sinusitis. There is atherosclerotic  calcification of the cavernous carotid arteries bilaterally. IMPRESSION: 1. Remote infarcts in the left parietal lobe and right frontal lobe with scattered remote lacunar infarcts, but no acute intracranial findings are identified on today's CT scan. 2. Periventricular white matter and corona radiata hypodensities favor chronic ischemic microvascular white matter disease. 3. Chronic bilateral maxillary sinusitis. Electronically Signed: By: Gaylyn Rong M.D. On: 2015-05-28 15:49   Ct Angio Neck W/cm &/or Wo/cm  2015-05-28  CLINICAL DATA:  Left-sided weakness. Prior strokes. History of diabetes. EXAM: CT ANGIOGRAPHY HEAD AND NECK TECHNIQUE: Multidetector CT imaging of the head and neck was performed using the standard protocol during bolus administration of intravenous contrast. Multiplanar CT image reconstructions and MIPs were obtained to evaluate the vascular anatomy. Carotid stenosis measurements (when applicable) are obtained utilizing NASCET criteria, using the distal internal carotid diameter as the denominator. CONTRAST:  100 mL Omnipaque 350 COMPARISON:  Head CT earlier today.  Head MRI/ MRA 07/10/2008. FINDINGS: CTA NECK Aortic arch: 3 vessel aortic arch with advanced atherosclerotic plaque. Brachiocephalic and right subclavian arteries are patent with non stenotic plaque. There is approximately 50% stenosis of the distal left subclavian artery. Right carotid system: Common carotid artery is patent without proximal stenosis. There  is extensive calcified and noncalcified plaque about the carotid bifurcation resulting in high-grade stenosis (radiographic string sign) of the proximal internal and distal common carotid arteries. There is also a high-grade stenosis of the external carotid artery origin. Left carotid system: Common carotid artery is patent with calcified and noncalcified plaque throughout its course resulting in less than 50% narrowing of its midportion. Heavily calcified plaque about  the carotid bifurcation results in less than 50% narrowing of the proximal internal carotid artery. Vertebral arteries: The vertebral arteries are patent with the right being mildly dominant. There is mild stenosis of the right vertebral artery at its origin. There is a severe stenosis of the mid to distal left V1 segment, with mild narrowing noted of the proximal left V2 segment. Skeleton: Moderate to severe cervical disc degeneration from C3-4 to C5-6. Other neck: Centrilobular emphysema with partially visualized ground-glass opacities and interlobular septal thickening compatible with pulmonary edema described on chest radiograph earlier today. Dependent opacities in the right upper lobe and superior segments of the lower lobes may represent atelectasis but are incompletely evaluated. Endotracheal and enteric tubes are noted, with the enteric tube partially looped posteriorly in the oropharynx before continuing inferiorly. A small amount of venous gas in the supraclavicular regions is likely related to venipuncture. There is a small amount of fluid in the oropharynx and nasopharynx. CTA HEAD Anterior circulation: The internal carotid arteries are patent from skullbase to carotid termini. There is a moderate to severe stenosis of the proximal right cavernous ICA with moderate narrowing involving the anterior genu and proximal supraclinoid ICA on the right. There is moderate stenosis of the proximal left cavernous ICA. The right A1 segment is absent. Left A1 is widely patent. M1 segments and MCA bifurcations are widely patent. There is mild-to-moderate MCA branch vessel irregularity and attenuation bilaterally. No intracranial aneurysm is identified. Posterior circulation: Intracranial internal carotid arteries are patent with the right being mildly dominant and with mild atherosclerotic irregularity bilaterally without significant stenosis. The left PICA origin is patent. Right PICA is not well seen. SCA origins  are patent. Basilar artery is patent without stenosis. Posterior communicating arteries are not identified. The PCAs are patent with moderate branch vessel irregularity and narrowing bilaterally but no significant proximal stenosis. Venous sinuses: Inadequately evaluated due to arterial phase contrast timing and lack of delayed phase imaging. Anatomic variants: Absent right A1 segment. IMPRESSION: 1. No medium or large vessel intracranial arterial occlusion. 2. Moderate to severe right and moderate left cavernous carotid stenoses. 3. High-grade stenosis (radiographic string sign) of the right carotid bulb. 4. No significant left-sided cervical carotid artery stenosis. 5. Severe proximal left vertebral artery stenosis. These results were called by telephone at the time of interpretation on 05/29/15 at 6:03 pm to Dr. Lavon Paganini, who verbally acknowledged these results. Electronically Signed   By: Sebastian Ache M.D.   On: 05/29/2015 18:09   Dg Chest Port 1 View  05/03/2015  CLINICAL DATA:  Respiratory failure, hypertension, diabetes mellitus, former smoker, coronary artery disease, hypertrophic obstructive cardiomyopathy EXAM: PORTABLE CHEST 1 VIEW COMPARISON:  Portable exam 0606 hours compared to May 29, 2015 FINDINGS: Rotated to the LEFT. Tip of endotracheal tube projects 1.9 cm above carina. Nasogastric tube extends into stomach. Enlargement of cardiac silhouette. Atherosclerotic calcifications aorta. Improved pulmonary infiltrates/pulmonary edema since prior exam. No definite pleural effusion or pneumothorax. IMPRESSION: Improved pulmonary edema. Electronically Signed   By: Ulyses Southward M.D.   On: 05/03/2015 08:16   Dg Chest Portable 1 View  29-May-2015  CLINICAL DATA:  Endotracheal tube placement. Hypertension. Acute myocardial infarction. Coronary artery disease. EXAM: PORTABLE CHEST 1 VIEW COMPARISON:  03/25/2015 at 4:01 p.m. FINDINGS: Newly placed endotracheal tube tip 3.6 cm above the carina, well  positioned. Nasogastric tube tip: Stomach body. Atherosclerotic aortic arch and descending thoracic aorta. Mild to moderate enlargement of the cardiopericardial silhouette with increasing bilateral interstitial accentuation compatible with acute interstitial pulmonary edema. IMPRESSION: 1. Endotracheal tube well positioned, tip 3.6 cm above the carina. Nasogastric tube tip: Stomach body. 2. Moderate enlargement of the cardiopericardial silhouette with acute interstitial pulmonary edema. This is mildly worsened since the prior radiograph from 37 minutes before. Electronically Signed   By: Gaylyn RongWalter  Liebkemann M.D.   On: 03/25/2015 16:51   Dg Chest Port 1 View  03/25/2015  CLINICAL DATA:  Code stroke, unresponsive, hypertension, diabetes mellitus, hyperlipidemia, coronary artery disease post MI, former smoker EXAM: PORTABLE CHEST 1 VIEW COMPARISON:  Portable exam 1601 hours compared to 04/10/2015 FINDINGS: Enlargement of cardiac silhouette. Mediastinal contours and pulmonary vascularity normal. Atherosclerotic calcification and tortuosity of thoracic aorta. Patchy RIGHT perihilar infiltrate with probable mild residual infiltrate at LEFT base as well. Overall slightly improved aeration versus previous exam. No pneumothorax or gross pleural effusion. Bones demineralized. IMPRESSION: Enlargement of cardiac silhouette. Slightly improved BILATERAL pulmonary infiltrates since 04/10/2015. Electronically Signed   By: Ulyses SouthwardMark  Boles M.D.   On: 03/25/2015 16:36     ASSESSMENT / PLAN:  NEUROLOGIC A:   Acute encephalopathy likely due to acute CVA - initial head CT / CTA negative for acute occlusion.  Concern that he has not been responsive since intubation which was almost 3 hours ago. ? Seizures - EEG done and results pending.  ammonia, TSH, B12, folate ok P:   Sedation:  Fentanyl PRN / Midazolam PRN. RASS goal: 0  Daily WUA. Neuro following-await EEG MRI once nature of dental implant  clarified   PULMONARY OETT 11/15 >>> A: Acute respiratory failure due to inability to protect airway. Acute Pulmonary edema. P:   VAP prevention measures. SBT as able - no extubation until mental status improves   CARDIOVASCULAR A:  Hx HTN, HLD,  CAD, MI. Mod AS - HOCM appearance on echo 2010  P:  Continue outpatient plavix, lovastain. Hold outpatient amlodipine, HCTZ, imdur, lopressor, nitro, ramipril. Rpt echo  RENAL A:   CKD. P:   NS @ 75. BMP in AM.  GASTROINTESTINAL A:   GI prophylaxis. Nutrition. P:   SUP: Pantoprazole. NPO. Start TFs  HEMATOLOGIC A:  VTE Prophylaxis. P:  SCD's / Heparin. CBC in AM.  INFECTIOUS A:    Leukocytosis + hypothermia - no clear source of infection at this point. P:   BCx2 11/15 > UCx 11/15 > Abx: Vanc, start date 11/15,  Abx: Zosyn, start date 11/15 PCT algorithm to limit abx exposure.  ENDOCRINE A:   DM. P:   SSI. Hold outpatient NPH, lantus.   Family updated: Son and Daughter at bedside 11/15   Interdisciplinary Family Meeting v Palliative Care Meeting:  Due by: 11/21.  Summary - Acute encephalopathy> DDx includes seizure activity vs basilar or posterior circulation occlusion not seen on CT -Await MRI brain but he has metal dental implants Acute respiratory failure with hypoxemia due to pulmonary edema   The patient is critically ill with multiple organ systems failure and requires high complexity decision making for assessment and support, frequent evaluation and titration of therapies, application of advanced monitoring technologies and extensive interpretation of multiple databases. Critical Care Time  devoted to patient care services described in this note independent of APP time is 32 minutes.   Cyril Mourning MD. Tonny Bollman. [facility] Pager [phone] If no response call [phone]   05/03/2015, 8:32 AM

## 2015-05-03 NOTE — Progress Notes (Signed)
Initial Nutrition Assessment  INTERVENTION:   Initiate Vital AF 1.2  @ 20 ml/hr via OG tube and increase by 10 ml every 4 hours to goal rate of 60 ml/hr.   Tube feeding regimen provides 1728 kcal, 108 grams of protein, and 1167 ml of H2O.   NUTRITION DIAGNOSIS:   Inadequate oral intake related to inability to eat as evidenced by NPO status.  GOAL:   Patient will meet greater than or equal to 90% of their needs  MONITOR:   Vent status, Labs, Weight trends, TF tolerance, I & O's  REASON FOR ASSESSMENT:   Consult Enteral/tube feeding initiation and management  ASSESSMENT:   Pt with recent admission for PNA, admitted with AMS with concern for CVA and Sz.   Patient is currently intubated on ventilator support MV: 8.6 L/min Temp (24hrs), Avg:98.1 F (36.7 C), Min:95.3 F (35.2 C), Max:99.9 F (37.7 C)  Labs reviewed: phosphorus low (2.1) Nutrition-Focused physical exam completed. Findings are no fat depletion, no muscle depletion, and mild edema.  Per wife of 63 years pt has been doing well PTA, at his last MD visit MD stated how well he was and doing good with blood sugar control. No recent weight loss and eating normally for pt.   Diet Order:  Diet NPO time specified  Skin:  Reviewed, no issues  Last BM:  11/15  Height:   Ht Readings from Last 1 Encounters:  05/11/2015 5\' 8"  (1.727 m)   Weight:   Wt Readings from Last 1 Encounters:  05/03/15 162 lb 14.7 oz (73.9 kg)   Ideal Body Weight:  70 kg  BMI:  Body mass index is 24.78 kg/(m^2).  Estimated Nutritional Needs:   Kcal:  1712  Protein:  90-100 grams  Fluid:  > 1.7L/day  EDUCATION NEEDS:   No education needs identified at this time  Kendell BaneHeather Chastin Garlitz RD, LDN, CNSC [phone] Pager [phone] After Hours Pager

## 2015-05-03 NOTE — Progress Notes (Signed)
Discussed with Vinnie LangtonGretchen t/o shift about pt's fluctating neuro exam.  MD made aware about pt RUE flexion, LUE localize, and BUE fluctating between extension and withdrawal. Pupils 3+ sluggish No further orders. Will continue to monitor.

## 2015-05-03 NOTE — Care Management Note (Signed)
Case Management Note  Patient Details  Name: Melanee LeftDonnie C Yahr MRN: [id number] Date of Birth: [birth year]  Subjective/Objective:   Pt admitted on 04-17-2015 with AMS, suspected CVA.  PTA, pt resided at home with spouse.  He ambulates with cane and walker.  Pt refused HH services with last admission in October.                   Action/Plan: Pt currently sedated and on ventilator.  Will follow for discharge planning as pt progresses.    Expected Discharge Date:                  Expected Discharge Plan:  IP Rehab Facility  In-House Referral:     Discharge planning Services  CM Consult  Post Acute Care Choice:    Choice offered to:     DME Arranged:    DME Agency:     HH Arranged:    HH Agency:     Status of Service:  In process, will continue to follow  Medicare Important Message Given:    Date Medicare IM Given:    Medicare IM give by:    Date Additional Medicare IM Given:    Additional Medicare Important Message give by:     If discussed at Long Length of Stay Meetings, dates discussed:    Additional Comments:  Quintella BatonJulie W. Tametha Banning, RN, BSN  Trauma/Neuro ICU Case Manager [phone]

## 2015-05-03 NOTE — Progress Notes (Signed)
Subjective: 81 year old male patient was brought into the emergency room with altered mental status and initially was noted to have a left gaze deviation or preference. His initial CT of the brain showed a chronic left MCA infarct as well as a small remote right MCA posterior frontal infarct. No definite acute pathology was noticeable on the initial CT scan. Eventually he developed respiratory distress and was intubated for airway protection. He continues to remain intubated. He is tracking movements in room.   Exam: Filed Vitals:   05/03/15 0900  BP: 137/62  Pulse: 86  Temp: 99.3 F (37.4 C)  Resp: 16   Neuro: Gen: In bed, NAD MS: alert follows only simple commands intermittently, such as to move his feet, although not consistent.  CN: PERRLA, EOMI, face creases and grimace grossly appears symmetric, although difficult to assess with intubation.  Motor / sensory:minimal withdrawal response in all limbs to tactile stimulus.   Pertinent Labs: WBC 20.7 EEG shows no epileptiform activity Cerebral perfusion pending   Impression:  81 year old male patient was brought into the emergency room with altered mental status and initially was noted to have a left gaze deviation or preference. His initial CT of the brain showed a chronic left MCA infarct as well as a small remote right MCA posterior frontal infarct. No definite acute pathology was noticeable on the initial CT scan. Eventually he developed respiratory distress and was intubated for airway protection. He continues to remain intubated. He is more alert this morning and able to follow some commands inconsistently.  I recommend obtaining a CT perfusion study to further evaluate for any acute cerebrovascular event. Unfortunately MRI could not be done due to a prosthetic penile implant.  At the time of finalizing this note, CT perfusion studies been completed which showed evidence of hypoperfusion in the left ACA and MCA vascular territories  adjacent to a previously known chronic left MCA infarct. These findings may suggest acute left ACA and MCA ischemia. Of note, EEG done yesterday in the emergency room did not show any abnormal epileptiform activity.  Recommend continuing supportive care in the ICU with blood pressure management. We'll plan for repeat CT of the brain tomorrow which may be helpful to evaluate for any evolving acute infarcts when compared to the prior initial CT scan.   Will  follow up.   05/03/2015, 10:05 AM

## 2015-05-04 ENCOUNTER — Inpatient Hospital Stay (HOSPITAL_COMMUNITY): Payer: Medicare Other

## 2015-05-04 ENCOUNTER — Encounter (HOSPITAL_COMMUNITY): Payer: Self-pay

## 2015-05-04 DIAGNOSIS — Z515 Encounter for palliative care: Secondary | ICD-10-CM

## 2015-05-04 DIAGNOSIS — I6330 Cerebral infarction due to thrombosis of unspecified cerebral artery: Secondary | ICD-10-CM

## 2015-05-04 DIAGNOSIS — J9621 Acute and chronic respiratory failure with hypoxia: Secondary | ICD-10-CM

## 2015-05-04 DIAGNOSIS — Z66 Do not resuscitate: Secondary | ICD-10-CM

## 2015-05-04 LAB — BASIC METABOLIC PANEL
ANION GAP: 10 (ref 5–15)
BUN: 22 mg/dL — ABNORMAL HIGH (ref 6–20)
CALCIUM: 8.7 mg/dL — AB (ref 8.9–10.3)
CO2: 22 mmol/L (ref 22–32)
Chloride: 107 mmol/L (ref 101–111)
Creatinine, Ser: 1.31 mg/dL — ABNORMAL HIGH (ref 0.61–1.24)
GFR, EST AFRICAN AMERICAN: 57 mL/min — AB (ref >60)
GFR, EST NON AFRICAN AMERICAN: 49 mL/min — AB (ref >60)
Glucose, Bld: 225 mg/dL — ABNORMAL HIGH (ref 65–99)
POTASSIUM: 3.9 mmol/L (ref 3.5–5.1)
Sodium: 139 mmol/L (ref 135–145)

## 2015-05-04 LAB — GLUCOSE, CAPILLARY
GLUCOSE-CAPILLARY: 179 mg/dL — AB (ref 65–99)
Glucose-Capillary: 193 mg/dL — ABNORMAL HIGH (ref 65–99)
Glucose-Capillary: 220 mg/dL — ABNORMAL HIGH (ref 65–99)

## 2015-05-04 LAB — CBC
HCT: 36 % — ABNORMAL LOW (ref 39.0–52.0)
Hemoglobin: 11.5 g/dL — ABNORMAL LOW (ref 13.0–17.0)
MCH: 26.6 pg (ref 26.0–34.0)
MCHC: 31.9 g/dL (ref 30.0–36.0)
MCV: 83.1 fL (ref 78.0–100.0)
Platelets: 400 K/uL (ref 150–400)
RBC: 4.33 MIL/uL (ref 4.22–5.81)
RDW: 17.6 % — ABNORMAL HIGH (ref 11.5–15.5)
WBC: 18.1 K/uL — ABNORMAL HIGH (ref 4.0–10.5)

## 2015-05-04 LAB — PROCALCITONIN: Procalcitonin: 0.57 ng/mL

## 2015-05-04 LAB — URINE CULTURE: CULTURE: NO GROWTH

## 2015-05-04 LAB — TROPONIN I: Troponin I: 0.11 ng/mL — ABNORMAL HIGH

## 2015-05-04 MED ORDER — MORPHINE SULFATE 25 MG/ML IV SOLN
1.0000 mg/h | INTRAVENOUS | Status: DC
  Administered 2015-05-04: 5 mg/h via INTRAVENOUS
  Filled 2015-05-04: qty 10

## 2015-05-04 MED ORDER — MIDAZOLAM HCL 5 MG/ML IJ SOLN
1.0000 mg/h | INTRAMUSCULAR | Status: DC
  Administered 2015-05-04: 5 mg/h via INTRAVENOUS
  Filled 2015-05-04: qty 10

## 2015-05-04 MED ORDER — MORPHINE BOLUS VIA INFUSION
5.0000 mg | INTRAVENOUS | Status: DC | PRN
  Filled 2015-05-04: qty 20

## 2015-05-04 MED ORDER — MIDAZOLAM BOLUS VIA INFUSION
5.0000 mg | INTRAVENOUS | Status: DC | PRN
  Filled 2015-05-04: qty 20

## 2015-05-05 ENCOUNTER — Other Ambulatory Visit: Payer: Self-pay | Admitting: Cardiology

## 2015-05-05 MED ORDER — IOHEXOL 350 MG/ML SOLN
50.0000 mL | Freq: Once | INTRAVENOUS | Status: AC | PRN
  Administered 2015-05-02: 50 mL via INTRAVENOUS

## 2015-05-07 LAB — CULTURE, BLOOD (ROUTINE X 2)
CULTURE: NO GROWTH
Culture: NO GROWTH

## 2015-05-09 ENCOUNTER — Telehealth: Payer: Self-pay

## 2015-05-09 NOTE — Telephone Encounter (Signed)
On 05/09/2015 I received a death certificate from [facility]. The death certificate is for burial. The patient is a patient of Doctor Vassie LollAlva. [facility] am [facility]. On 05/10/2015 I received the death certificate back from Doctor Vassie LollAlva. I got the death certificate ready and called the funeral home to let them know I was [facility] the [facility] per [facility].

## 2015-05-18 NOTE — Progress Notes (Signed)
STROKE TEAM PROGRESS NOTE   HISTORY [name] is an 81 y.o. male who was with his wife at 10:30 this AM. He was watching TV from 10:30 -13:30 but did not talk to anyone during this time. At 13:30 he was noted to slump over and not be responsive. He was brought to hospitaland noted to have left arm in flexion, left eye deviation, right hemianopsia. It was not clear if he was having seizures versus CVA. He was brought to MRI to evaluate for stroke. There was a delay in getting MRI due to needing chest Xray, KUB and penile implant assessment for metal prior to performing. Pt was last known well 05/22/2015 at 10:30. Patient was not administered TPA secondary to out of window.  He was admitted to the neuro ICU for further evaluation and treatment. Once imaging confirmed new acute stroke, he was transitioned to the stroke service.   SUBJECTIVE (INTERVAL HISTORY) His eldest son is at the bedside.  He shared his past medical history. Patient had a stroke 4 years ago and was found to have symptomatic carotid stenosis but due to significant cardiac history was not considered a candidate for carotid revascularization.. Son asked prognosis, shared that pt said he would not want to live disabled. Son stated he knew it would be his mother's final decision, but he knew that would be what the pt would want.    OBJECTIVE Temp:  [98.6 F (37 C)-100.2 F (37.9 C)] 98.6 F (37 C) (11/17 0700) Pulse Rate:  [42-104] 104 (11/17 0827) Cardiac Rhythm:  [-] Atrial fibrillation (11/16 2000) Resp:  [17-27] 22 (11/17 0827) BP: (88-166)/(50-124) 135/74 mmHg (11/17 0827) SpO2:  [97 %-100 %] 97 % (11/17 0827) FiO2 (%):  [40 %] 40 % (11/17 0827) Weight:  [73.4 kg (161 lb 13.1 oz)] 73.4 kg (161 lb 13.1 oz) (11/17 0326)  CBC:  Recent Labs Lab 2015/05/22 1527  05/03/15 0434 04/20/2015 0240  WBC 18.2*  --  20.7* 18.1*  NEUTROABS 15.7*  --   --   --   HGB 12.3*  < > 12.3* 11.5*  HCT 38.8*  < > 38.6* 36.0*  MCV  83.3  --  83.4 83.1  PLT 414*  --  387 400  < > = values in this interval not displayed.  Basic Metabolic Panel:   Recent Labs Lab 05/03/15 0434 04/19/2015 0240  NA 140 139  K 4.3 3.9  CL 106 107  CO2 24 22  GLUCOSE 149* 225*  BUN 18 22*  CREATININE 1.57* 1.31*  CALCIUM 9.1 8.7*  MG 1.8  --   PHOS 2.1*  --     Lipid Panel:     Component Value Date/Time   CHOL 123* 04/24/2015 1305   TRIG 84 04/24/2015 1305   HDL 37* 04/24/2015 1305   CHOLHDL 3.3 04/24/2015 1305   VLDL 17 04/24/2015 1305   LDLCALC 69 04/24/2015 1305   HgbA1c:  Lab Results  Component Value Date   HGBA1C 6.3* 04/24/2015   Urine Drug Screen:     Component Value Date/Time   LABOPIA NONE DETECTED 05/22/15 1803   COCAINSCRNUR NONE DETECTED May 22, 2015 1803   LABBENZ POSITIVE* 05-22-2015 1803   AMPHETMU NONE DETECTED 05-22-2015 1803   THCU NONE DETECTED 05/22/2015 1803   LABBARB NONE DETECTED 22-May-2015 1803      IMAGING  Dg Abd 1 View 05/22/2015   Increased stool in rectum. Nonobstructive bowel gas pattern.   Ct Head Wo Contrast 04/21/2015   1.  Acute evolving large left MCA territory infarct again noted, generally unchanged in appearance from the recent prior CT perfusion study. No evidence of hemorrhagic transformation at this time. 2. Diffuse small vessel ischemic microangiopathy and mild cortical volume loss. 3. Chronic infarct at the right cerebellar hemisphere. Chronic lacunar infarcts at the basal ganglia and thalami bilaterally. Chronic infarcts at the high right parietal and left posterior parietal lobes.   Ct Head Wo Contrast 04/18/2015   1. Remote infarcts in the left parietal lobe and right frontal lobe with scattered remote lacunar infarcts, but no acute intracranial findings are identified on today's CT scan. 2. Periventricular white matter and corona radiata hypodensities favor chronic ischemic microvascular white matter disease. 3. Chronic bilateral maxillary sinusitis.   Ct Angio  Head & Neck W/cm &/or Wo/cm 04/29/2015  1. No medium or large vessel intracranial arterial occlusion. 2. Moderate to severe right and moderate left cavernous carotid stenoses. 3. High-grade stenosis (radiographic string sign) of the right carotid bulb. 4. No significant left-sided cervical carotid artery stenosis. 5. Severe proximal left vertebral artery stenosis.   Ct Cerebral Perfusion W/cm 05/03/2015  Evolving posterior left evolving posterior left PCA and left ACA (ANTERIOR CEREBRAL ARTERY) and adjacent posterior left MCA infarct with confluent cytotoxic edema." adjacent posterior left MCA infarct  with confluent cytotoxic edema. No hemorrhage or mass effect. 2. Perfusion data in the posterior left ACA component is consistent with infarct core.    Dg Chest Port 1 View 05/03/2015   Improved pulmonary edema. 04/22/2015   1. Endotracheal tube well positioned, tip 3.6 cm above the carina. Nasogastric tube tip: Stomach body. 2. Moderate enlargement of the cardiopericardial silhouette with acute interstitial pulmonary edema. This is mildly worsened since the prior radiograph from 37 minutes before.  05/08/2015   Enlargement of cardiac silhouette. Slightly improved BILATERAL pulmonary infiltrates since 04/10/2015.   EEG no abnormal epileptiform activity or seizures.   2D Echocardiogram  - Left ventricle: Wall thickness was increased in a pattern of mildLVH. There was focal basal hypertrophy. Systolic function wasmoderately reduced. The estimated ejection fraction was in therange of 35% to 40%. Severe hypokinesis of the mid-apicalinferiormyocardium. - Aortic valve: There was moderate stenosis. There was mild regurgitation. Valve area (VTI): 0.77 cm^2. Valve area (Vmax):0.72 cm^2. Valve area (Vmean): 0.69 cm^2. - Mitral valve: Mildly calcified annulus. There was mildregurgitation. Valve area by continuity equation (using LVOTflow): 1.77 cm^2. - Left atrium: The atrium was severely  dilated.   PHYSICAL EXAM Elderly Caucasian male who is intubated. . Afebrile. Head is nontraumatic. Neck is supple without bruit.    Cardiac exam no murmur or gallop. Lungs are clear to auscultation. Distal pulses are well felt. Neurological Exam :  Patient is intubated. Comatose and unresponsive. Eyes partially open to sternal rub. Left gaze deviation but there are some spontaneous side to side eye movements noted. Pupils irregular but reactive. Corneal reflexes are present. Fundi were not visualized. Right facial weakness. Tongue midline. Motor system exam reveals no spontaneous extremity movements but will withdraw left upper and lower extremity to pain no movement in the right upper or lower extremity. Right plantar upgoing left downgoing. ASSESSMENT/PLAN Mr. [name] is a 81 y.o. male with history of previous L brain stroke, HTN, HLD, type 2 diabetes, CAD, HOCM with complaints of shortness of breath for a few days, presenting after slumping over and becoming unresponsive, new L sided weakness. He did not receive IV t-PA due to delay in arrival, unclear stroke symptoms.   Stroke:  Non-dominant left ACA and posterior MCA infarct in setting of old L MCA infarct, strokes felt to be  thromboembolic etiology undetermined.  Resultant  Left hemiparesis, stuporous state  MRI  Penile implant  CTA in ED high grade proximal R ICA stenosis > 90%. Severe proximal L VA stenosis  CTP chronic L MCA infarct, hypoperfusion L ACA and MCA acute ischemia  2D Echo  EF 35-40%, severe HK, mod AV stenosis, severely dilated LA  LDL 69  HgbA1c 6.3  Heparin 5000 units sq tid for VTE prophylaxis Diet NPO time specified  clopidogrel 75 mg daily prior to admission, now on clopidogrel 75 mg daily  Ongoing aggressive stroke risk factor management  Therapy recommendations:  pending   Disposition:  pending   Overall prognosis for meaningful return of neuro function limited. Dr. Pearlean BrownieSethi discussed in  detail with son who reports father would not want to live this way. Son knows it is wife's ultimate decision.  Acute Respiratory Failure Acute Pulmonary Edema  intubated in ED d/t inability to protect airway  CCM attending  Carotid Stenosis  High grade R ICA 90% stenosis  VVS consult - not a candidate for surgical intervention unless improvement. Can re-eval in 6 weeks  Hypertension  Stable  Permissive hypertension (OK if < 220/120) but gradually normalize in 5-7 days  Hyperlipidemia  Home meds:  Mevacor 10, changed to pravachol in hospital  LDL 69, goal < 70  Continue statin at discharge, depending on plan of care  Diabetes type 2  HgbA1c 6.3, at goal < 7.0  Controlled  Other Stroke Risk Factors  Advanced age  Former Cigarette smoker, quit smoking 45 years ago   Hx stroke/TIA - per son - hx L brain stroke 5 years ago due to symptomatic L ICA, not felt to be a surgical candidate due to age and risk of surgery, pt with resultant R arm HP  Coronary artery disease - PTCA and stent 2001, [place]  Other Active Problems  Hypokalemia  CKD  Obstructive cardiomyopathy  Leukocytosis and hypothermia without source of infection  Hospital day # 2  Thurman CoyerBIBY,SHARON  [facility] See Amion for Pager information February 06, 2015 1:10 PM   I have personally examined this patient, reviewed notes, independently viewed imaging studies, participated in medical decision making and plan of care. I have made any additions or clarifications directly to the above note. Agree with note above. Patient presented with altered mental status and initial evaluation was for stroke was unrevealing with negative CT scan and CT angiogram but subsequently seemed to have neurologically worsened with follow-up scan is now showing left anterior cerebral and left middle cerebral artery infarcts. Neurological prognosis is poor he is at significant risk for cytotoxic edema, neurological worsening,  recurrent strokes. I had a long discussion of the bedside with patient's family who clearly stated patient's wishes of not requiring prolonged ventilatory support and living a life of disability going to a nursing home but unfortunately unavoidable. Family is leaning to towards comfort care and withdrawal of care. This patient is critically ill and at significant risk of neurological worsening, death and care requires constant monitoring of vital signs, hemodynamics,respiratory and cardiac monitoring, extensive review of multiple databases, frequent neurological assessment, discussion with family, other specialists and medical decision making of high complexity.I have made any additions or clarifications directly to the above note.This critical care time does not reflect procedure time, or teaching time or supervisory time of PA/NP/Med Resident etc but could involve care discussion  time.  I spent 50 minutes of neurocritical care time  in the care of  this patient.     Delia Heady, MD Medical Director [facility] Pager: [phone] 05/12/2015 8:08 PM   To contact Stroke Continuity provider, please refer to [email or web address]. After hours, contact General Neurology

## 2015-05-18 NOTE — Progress Notes (Signed)
Wasted 200cc Morphine and 20cc of Versed with Philis NettleLisa Siravo, RN.

## 2015-05-18 NOTE — Progress Notes (Signed)
Morphine and versed started.  Pt placed on comfort care per order.  DNR in place.  Pt extubated at this time.  Family at bedside.  Will continue to provide support for family.

## 2015-05-18 NOTE — Procedures (Signed)
Extubation Procedure Note  Patient Details:   Name: [name] DOB: [birth year] MRN: [id number]   Airway Documentation:  Airway 7.5 mm (Active)  Secured at (cm) 25 cm 07-22-14 12:24 PM  Measured From Lips 07-22-14 12:24 PM  Secured Location Center 07-22-14 12:24 PM  Secured By [employer] Tube Holder 07-22-14 12:24 PM  Tube Holder Repositioned Yes 07-22-14 12:24 PM  Cuff Pressure (cm H2O) 27 cm H2O 05/03/2015 10:46 PM  Site Condition Dry;Cool 07-22-14 12:24 PM    Evaluation  O2 sats: stable throughout Complications: No apparent complications Patient did tolerate procedure well. Bilateral Breath Sounds: Clear, Diminished Suctioning: Oral, Airway No   Patient was extubated due to end of life withdrawal orders. RN and family were at bedside with RT during extubation procedure. Cuff leak was noted. No stridor was heard. RT will continue to monitor.  Darolyn Ruashley M Magdalynn Davilla 07-22-14, 3:45 PM

## 2015-05-18 NOTE — Progress Notes (Signed)
PULMONARY / CRITICAL CARE MEDICINE   Name: [name] MRN: [id number] DOB: [birth year]    ADMISSION DATE:  04/30/2015 CONSULTATION DATE:  04/23/2015  REFERRING MD :  EDP  CHIEF COMPLAINT:  AMS  INITIAL PRESENTATION:  81 y.o. male brought to [facility] ED 11/15 for AMS.  Code stroke was called in ED; CT and CTA were negative for acute infarct.however, there was also concern for seizures.  He was intubated for airway protection and PCCM called for admission.  he has hx of remote stroke with residual right hand weakness  STUDIES:  CT / CTA head 11/15 >>> no medium or large vessel occlusion. EEG 11/15 >>>Generalized irregular slow acitivity Echo 11/16 >> EF 35%, hypokinetic inf wall, mod AS 11/17 head CT >> Acute evolving large left MCA territory infarct   SIGNIFICANT EVENTS: 11/15 - admitted with AMS and concern for CVA. 10/24- 04/12/15 recent admission  for CAP (treated with rocephin / azithro and discharged on 5 days augmentin).   SUBJECTIVE: afebrile Remains unresponsive No seizure activity UO ok  VITAL SIGNS: Temp:  [98.6 F (37 C)-100.2 F (37.9 C)] 98.6 F (37 C) (11/17 0700) Pulse Rate:  [42-104] 104 (11/17 0827) Resp:  [17-27] 22 (11/17 0827) BP: (88-166)/(50-124) 135/74 mmHg (11/17 0827) SpO2:  [97 %-100 %] 97 % (11/17 0827) FiO2 (%):  [40 %] 40 % (11/17 0827) Weight:  [161 lb 13.1 oz (73.4 kg)] 161 lb 13.1 oz (73.4 kg) (11/17 0326) HEMODYNAMICS:   VENTILATOR SETTINGS: Vent Mode:  [-] PSV;CPAP FiO2 (%):  [40 %] 40 % Set Rate:  [14 bmp] 14 bmp Vt Set:  [550 mL] 550 mL PEEP:  [5 cmH20] 5 cmH20 Pressure Support:  [5 cmH20] 5 cmH20 Plateau Pressure:  [18 cmH20] 18 cmH20 INTAKE / OUTPUT: Intake/Output      11/16 0701 - 11/17 0700 11/17 0701 - 11/18 0700   I.V. (mL/kg) 1800 (24.5)    NG/GT 598.3    IV Piggyback 305    Total Intake(mL/kg) 2703.3 (36.8)    Urine (mL/kg/hr) 985 (0.6)    Stool     Total Output 985     Net +1718.3            PHYSICAL  EXAMINATION: General: Adult male, in NAD. Neuro: Non-responsive despite no sedation, localises LUE, extensor BLEs. HEENT: [place]/AT. PERRL, sclerae anicteric.  ETT in place. Cardiovascular: RRR, 4/6 SEM. Lungs: Respirations even and unlabored.  Coarse bilaterally. Abdomen: BS x 4, soft, NT/ND.  Musculoskeletal: No gross deformities, no edema.  Skin: Intact, warm, no rashes.  LABS:  CBC  Recent Labs Lab 04/29/2015 1527 05/01/2015 1533 05/03/15 0434 2015-05-23 0240  WBC 18.2*  --  20.7* 18.1*  HGB 12.3* 15.0 12.3* 11.5*  HCT 38.8* 44.0 38.6* 36.0*  PLT 414*  --  387 400   Coag's  Recent Labs Lab 04/28/2015 1527  APTT 32  INR 1.20   BMET  Recent Labs Lab 05/09/2015 1527 05/01/2015 1533 05/03/15 0434 05-23-15 0240  NA 137 141 140 139  K 3.4* 3.4* 4.3 3.9  CL 104 103 106 107  CO2 24  --  24 22  BUN 20 21* 18 22*  CREATININE 1.67* 1.50* 1.57* 1.31*  GLUCOSE 124* 121* 149* 225*   Electrolytes  Recent Labs Lab 05/01/2015 1527 05/03/15 0434 05/23/2015 0240  CALCIUM 9.5 9.1 8.7*  MG  --  1.8  --   PHOS  --  2.1*  --    Sepsis Markers  Recent Labs Lab 05/13/2015  2056 05/03/15 0434 04/26/2015 0240  PROCALCITON 0.20 0.64 0.57   ABG  Recent Labs Lab 06-25-14 1739 05/03/15 0451  PHART 7.390 7.445  PCO2ART 38.7 32.1*  PO2ART 262.0* 139*   Liver Enzymes  Recent Labs Lab 06-25-14 1527  AST 19  ALT 10*  ALKPHOS 56  BILITOT 0.7  ALBUMIN 3.7   Cardiac Enzymes  Recent Labs Lab 05/03/15 1515 05/03/15 2014 04/29/2015 0246  TROPONINI 0.06* 0.07* 0.11*   Glucose  Recent Labs Lab 05/03/15 1131 05/03/15 1629 05/03/15 2000 05/03/15 2355 05/11/2015 0340 05/17/2015 0739  GLUCAP 158* 139* 198* 220* 193* 179*    Imaging Ct Head Wo Contrast  04/21/2015  CLINICAL DATA:  Follow-up left MCA territory infarct. Subsequent encounter. EXAM: CT HEAD WITHOUT CONTRAST TECHNIQUE: Contiguous axial images were obtained from the base of the skull through the vertex without  intravenous contrast. COMPARISON:  CT of the head performed 02-26-15, and CT perfusion images performed earlier today at 12:20 p.m. FINDINGS: The acute evolving large left MCA territory infarct again noted, generally unchanged in appearance from the recent prior CT perfusion study. No significant mass effect or midline shift is seen. There is no evidence of hemorrhagic transformation at this time. Diffuse periventricular and subcortical white matter change likely reflects small vessel ischemic microangiopathy. Prominence of the ventricles and sulci reflects mild cortical volume loss. Cerebellar atrophy is noted. A chronic infarct is noted at the right cerebellar hemisphere. Chronic lacunar infarcts are seen at the basal ganglia and thalami bilaterally. Chronic infarcts are noted at the high right parietal and left posterior parietal lobes. The brainstem and fourth ventricle are within normal limits. There is no evidence of fracture; visualized osseous structures are unremarkable in appearance. The orbits are within normal limits. The paranasal sinuses and mastoid air cells are well-aerated. No significant soft tissue abnormalities are seen. IMPRESSION: 1. Acute evolving large left MCA territory infarct again noted, generally unchanged in appearance from the recent prior CT perfusion study. No evidence of hemorrhagic transformation at this time. 2. Diffuse small vessel ischemic microangiopathy and mild cortical volume loss. 3. Chronic infarct at the right cerebellar hemisphere. Chronic lacunar infarcts at the basal ganglia and thalami bilaterally. Chronic infarcts at the high right parietal and left posterior parietal lobes. Electronically Signed   By: Roanna RaiderJeffery  Chang M.D.   On: 05/13/2015 03:12   Ct Cerebral Perfusion W/cm  05/03/2015  ADDENDUM REPORT: 05/03/2015 13:49 ADDENDUM: Correction, impression #1 first sentence should read: "Evolving posterior left ACA (ANTERIOR CEREBRAL ARTERY) and adjacent posterior  left MCA infarct with confluent cytotoxic edema." Electronically Signed   By: Odessa FlemingH  Hall M.D.   On: 05/03/2015 13:49  05/03/2015  CLINICAL DATA:  81 year old male, code stroke presentation yesterday common negative for emergent large vessel occlusion on CTA. Initial encounter. EXAM: CT CEREBRAL PERFUSION WITH CONTRAST TECHNIQUE: Multi detector CT brain perfusion study with contrast. CONTRAST:  40mL OMNIPAQUE IOHEXOL 350 MG/ML SOLN COMPARISON:  CTA head and neck 02-26-15.  Brain MRI 07/10/2008. FINDINGS: Chronic MCA territory encephalomalacia, greater on the left and confluent in the left posterior MCA territory. Post processed perfusion maps demonstrate posterior left ACA territory abnormal profusion characteristics. Left versus right posterior ACA region of interest measurements as follows: Left: cerebral blood volume 1.2 mL/100g, cerebral blood flow 4.6 mL/100g/min, mean transit time 20.3 seconds and time to peak 32 seconds. Right: Cerebral blood volume 8.8 mL/100g, cerebral blood flow 91.8 mL/100g/min, mean transit time 6.1 seconds, time to peak 23.7 seconds. In the adjacent posterior left MCA  territory bordering the chronic left MCA encephalomalacia there is dropout of CT perfusion data. However, delayed post-contrast images demonstrate confluent cytotoxic edema also throughout this area (series 301, image 23). The evolving changes extend to the posterior left MCA encephalomalacia. No associated hemorrhage. No mass effect. IMPRESSION: 1. Evolving posterior left PCA and adjacent posterior left MCA infarct with confluent cytotoxic edema. No hemorrhage or mass effect. 2. Perfusion data in the posterior left ACA component is consistent with infarct core. Electronically Signed: By: Odessa Fleming M.D. On: 05/03/2015 13:10     ASSESSMENT / PLAN:  NEUROLOGIC A:   Acute encephalopathy likely due to acute CVA - initial head CT / CTA negative for acute occlusion.  Concern that he has not been responsive since intubation  which was almost 3 hours ago. ? Seizures - EEG done and results pending.  ammonia, TSH, B12, folate ok P:   Sedation:  Fentanyl PRN  RASS goal: 0  Neuro following   PULMONARY OETT 11/15 >>> A: Acute respiratory failure due to inability to protect airway. Acute Pulmonary edema -resolved. P:   SBT as able - no extubation until mental status improves   CARDIOVASCULAR A:  Hx HTN, HLD,  CAD, MI. Mod AS - EF has dropped since 2010 to 35% Demand ischemia P:  Continue outpatient plavix, lovastain. Hold outpatient amlodipine, HCTZ, imdur, lopressor, nitro, ramipril.  Not a candidate for invasive wu   RENAL A:   CKD. P:   Dc NS BMP in AM.  GASTROINTESTINAL A:   GI prophylaxis. Nutrition. P:   SUP: Pantoprazole. NPO. Ct TFs  HEMATOLOGIC A:  VTE Prophylaxis. P:  SCD's / Heparin. CBC in AM.  INFECTIOUS A:    Leukocytosis + hypothermia - no clear source of infection at this point. P:   BCx2 11/15 > UCx 11/15 > resp 11/15 >> Abx: Vanc, start date 11/15 >>11/17 Abx: Zosyn, start date 11/15 PCT algorithm to limit abx exposure.  ENDOCRINE A:   DM. P:   SSI. Hold outpatient NPH, lantus.   Family updated: Son  at bedside 11/17, he would not want long term life support  Interdisciplinary Family Meeting v Palliative Care Meeting:  Due by: 11/21.  Summary - Acute left MCA CVA , remains unresponsive -await neuro prognostication Acute respiratory failure with hypoxemia due to pulmonary edema   The patient is critically ill with multiple organ systems failure and requires high complexity decision making for assessment and support, frequent evaluation and titration of therapies, application of advanced monitoring technologies and extensive interpretation of multiple databases. Critical Care Time devoted to patient care services described in this note independent of APP time is 32 minutes.   Cyril Mourning MD. Tonny Bollman. [facility] Pager [phone] If no response call [phone]   05/25/15, 9:40 AM

## 2015-05-18 NOTE — Progress Notes (Signed)
Responded  to page to provide emotional, spiritual and grief support to family. Family made the decision to withdraw life support. At bedside is wife of 763 years and daughter.  Family has accepted the wishes of the patient and is honoring his healthcare directives. I Provided empathetic listening and  ministry of presence.  Will pass on to night Chaplain for continued support as needed.    05/03/2015 1500  Clinical Encounter Type  Visited With Patient and family together;Health care provider  Visit Type Initial;Spiritual support;Patient actively dying  Referral From Nurse  Spiritual Encounters  Spiritual Needs Prayer;Emotional;Grief support  Stress Factors  Family Stress Factors Loss  Advance Directives (For Healthcare)  Does patient have an advance directive? Yes  Venida JarvisWatlington, Navia Lindahl, Chaplain,[facility],pager [phone]

## 2015-05-18 NOTE — Progress Notes (Signed)
Pt expired at 1724.  Pronounced by Londell MohKaty Zoye Chandra, RN and Thomes CakeMari Furr, RN.  Pt's family at bedside.  CDS notified at 1734.  Elink MD notified.

## 2015-05-18 NOTE — Progress Notes (Signed)
Call from bedside RN Orpha BurKAty saying family has decided to terminal wean and patient is on PCCM servicer. D/w Neuro Dr Pearlean BrownieSethi who has confirmed terminal wean based on his discussion with family. Patient not on paralytic. I am not at bedside and order for comfort, dnar and terminal wean placed from remote location  Dr. Kalman ShanMurali Rolinda Impson, M.D., [facility]P Pulmonary and Critical Care Medicine Staff Physician [facility] Pager: [phone], If no answer or between  15:00h - 7:00h: call [phone]  04/28/2015 1:52 PM

## 2015-05-18 DEATH — deceased

## 2015-06-18 NOTE — Discharge Summary (Signed)
PULMONARY / CRITICAL CARE MEDICINE Death Note   Name: [name] MRN: [id number] DOB: 10-28-1930    ADMISSION DATE:  04/22/2015 CONSULTATION DATE:  05/17/2015  REFERRING MD :  EDP  CHIEF COMPLAINT:  AMS  Cause of Death: Left PCA, MCA, ACA stroke  Hospital course:  82 y.o. [facility] for AMS.  Code stroke was called in ED; CT and CTA were negative for acute infarct.however, there was also concern for seizures.  He was intubated for airway protection and PCCM called for admission.  A cerebral perfusion study showed evidence of a large stroke as detailed below.  The patient was seen by neurology and it was felt that chances of a meaningful recovery were limited.  The patient's son indicated that eh did not want prolonged life supportive therapies.  After discussion with family he was extubated in a palliative manner on a morphine drip.  He died with family at bedside.   STUDIES:  CT / CTA head 11/15 >>> no medium or large vessel occlusion. EEG 11/15 >>>Generalized irregular slow acitivity Echo 11/16 >> EF 35%, hypokinetic inf wall, mod AS 11/17 head CT >> Acute evolving large left MCA territory infarct  11/16 Cerebral perfusion study. Evolving posterior left PCA and left ACA and left MCA stroke  [name], MD [facility] Pager: [phone] Cell: [phone] After 3pm or if no response, call [phone]   06/01/2015, 6:01 PM

## 2015-08-30 ENCOUNTER — Ambulatory Visit: Payer: Medicare Other | Admitting: Nurse Practitioner

## 2015-08-30 ENCOUNTER — Other Ambulatory Visit: Payer: Medicare Other

## 2016-06-02 IMAGING — CT CT ANGIO NECK
2 of 9 series · 8 of 46 positions shown, 13 images · IV contrast (OMNI)
Comparison: Head CT earlier today.  Head MRI/ MRA 07/10/2008.

CLINICAL DATA: Left-sided weakness. Prior strokes. History of
diabetes.

EXAM:
CT ANGIOGRAPHY HEAD AND NECK
TECHNIQUE: Multidetector CT imaging of the head and neck was performed using
the standard protocol during bolus administration of intravenous
contrast. Multiplanar CT image reconstructions and MIPs were
obtained to evaluate the vascular anatomy. Carotid stenosis
measurements (when applicable) are obtained utilizing NASCET
criteria, using the distal internal carotid diameter as the
denominator.
CONTRAST:  100 mL Omnipaque 350

[Series 6: carotid/brain 2.0 i30f 3 · axial · 0.49mm/px · z∈[-367,-117]mm · 6 of 177 slices shown, 11 images]
[im 26/177  soft-tissue]
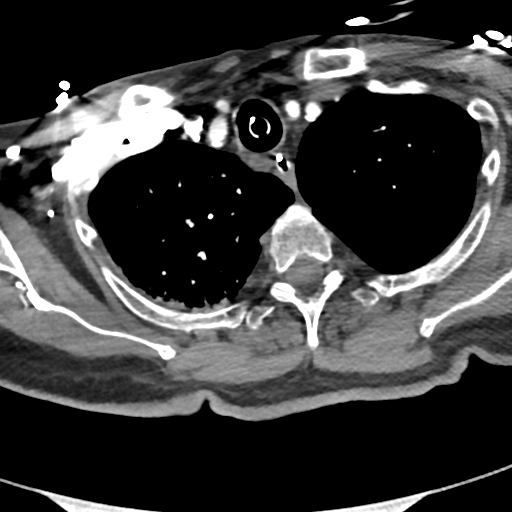
[im 26/177  bone]
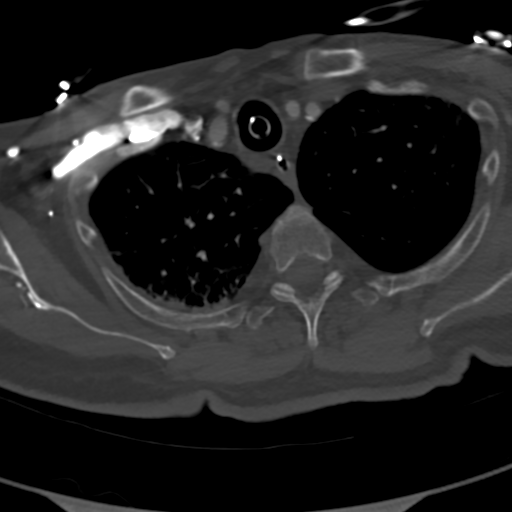
[im 51/177  soft-tissue]
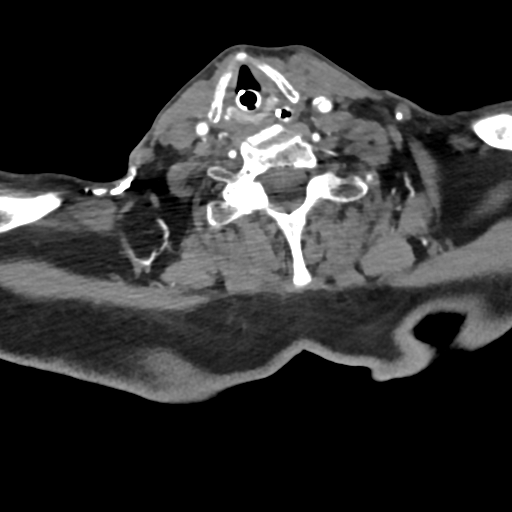
[im 76/177  soft-tissue]
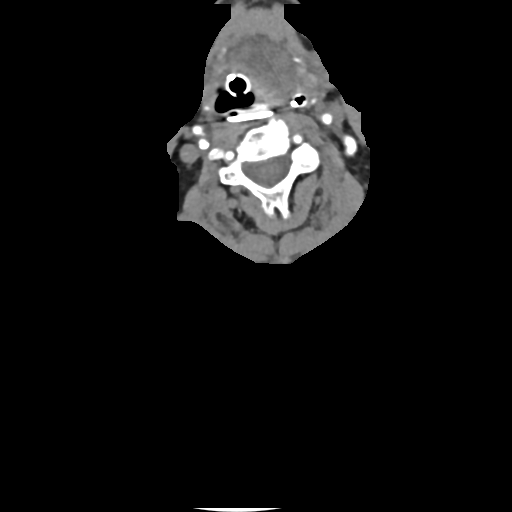
[im 76/177  lung]
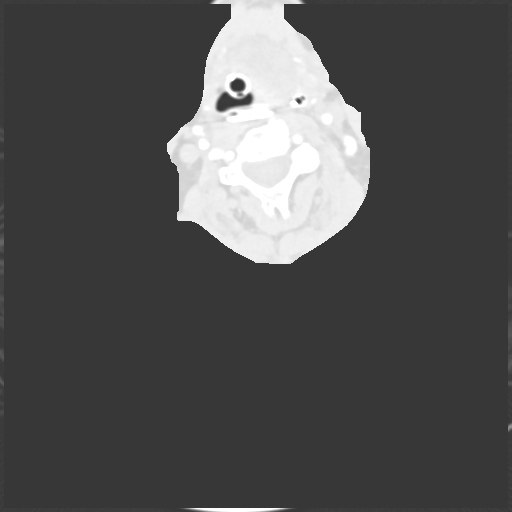
[im 101/177  soft-tissue]
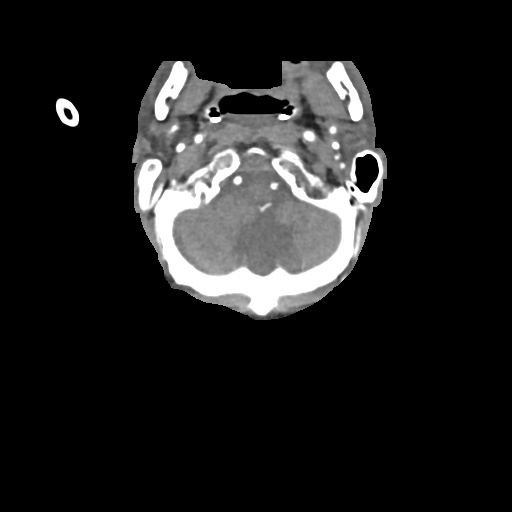
[im 101/177  lung]
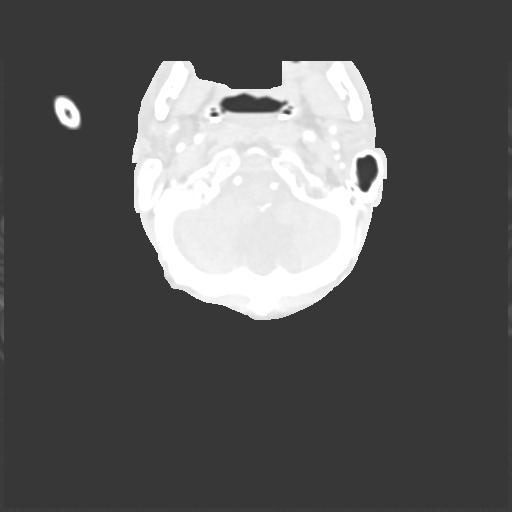
[im 126/177  soft-tissue]
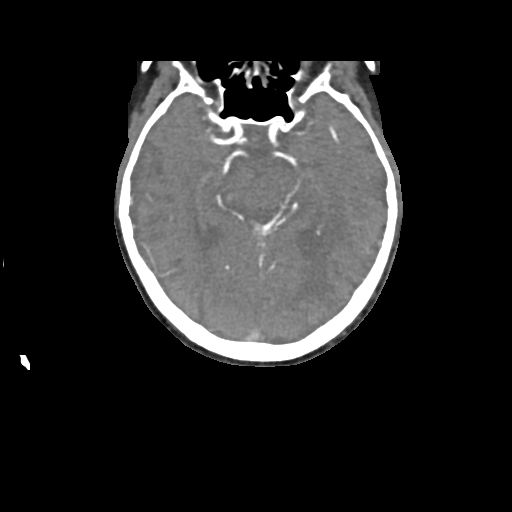
[im 126/177  lung]
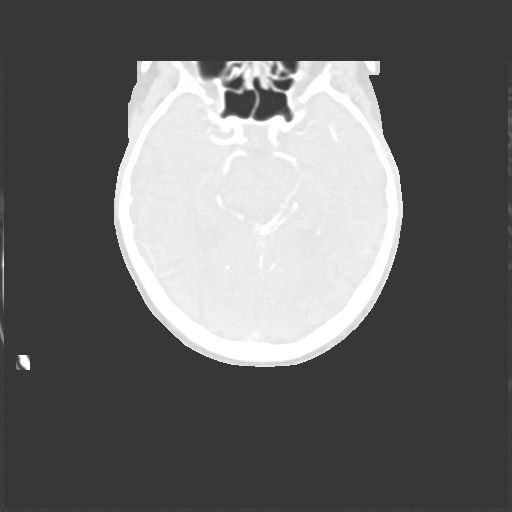
[im 151/177  soft-tissue]
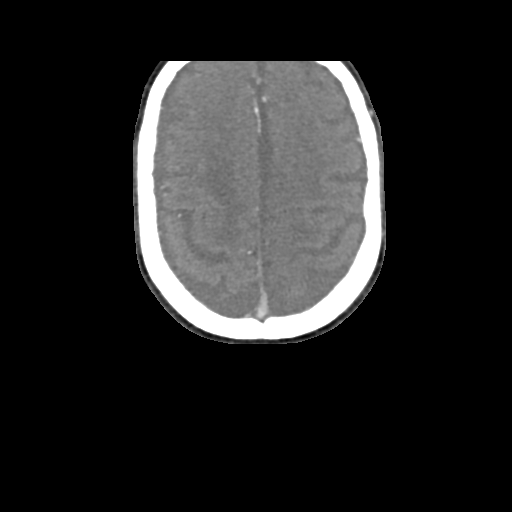
[im 151/177  lung]
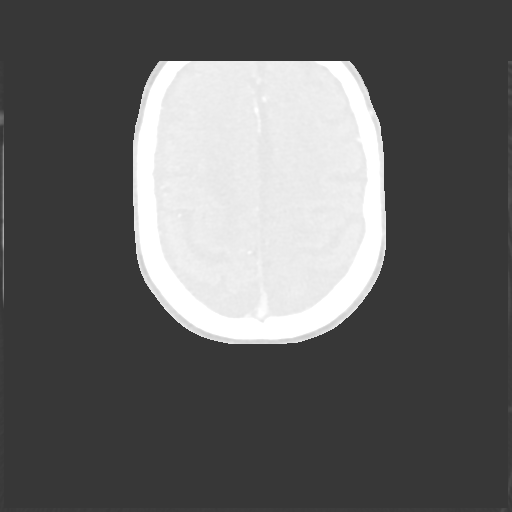

[Series 13: carotid mips (id) · axial · 0.49mm/px · z∈[-298,-188]mm · 2 of 67 slices shown]
[im 23/67  soft-tissue]
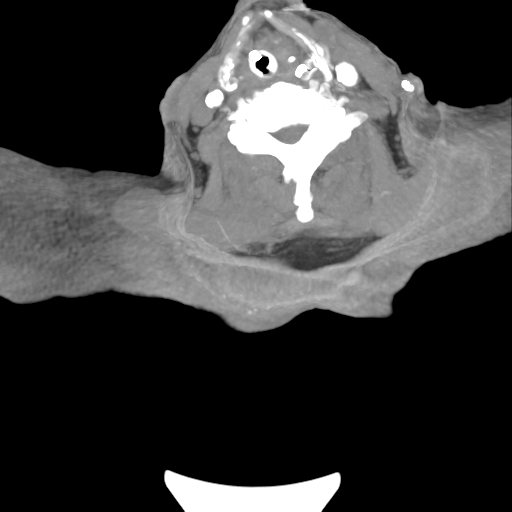
[im 45/67  soft-tissue]
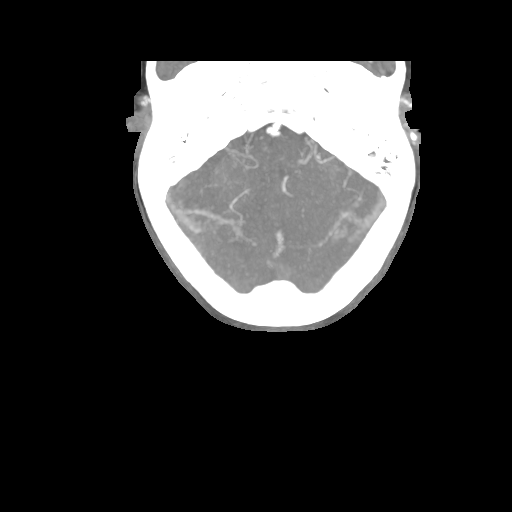

[8 of 46 positions shown; findings below may reference images not displayed]

FINDINGS: CTA NECK

Aortic arch: 3 vessel aortic arch with advanced atherosclerotic
plaque. Brachiocephalic and right subclavian arteries are patent
with non stenotic plaque. There is approximately 50% stenosis of the
distal left subclavian artery.

Right carotid system: Common carotid artery is patent without
proximal stenosis. There is extensive calcified and noncalcified
plaque about the carotid bifurcation resulting in high-grade
stenosis (radiographic string sign) of the proximal internal and
distal common carotid arteries. There is also a high-grade stenosis
of the external carotid artery origin.

Left carotid system: Common carotid artery is patent with calcified
and noncalcified plaque throughout its course resulting in less than
50% narrowing of its midportion. Heavily calcified plaque about the
carotid bifurcation results in less than 50% narrowing of the
proximal internal carotid artery.

Vertebral arteries: The vertebral arteries are patent with the right
being mildly dominant. There is mild stenosis of the right vertebral
artery at its origin. There is a severe stenosis of the mid to
distal left V1 segment, with mild narrowing noted of the proximal
left V2 segment.

Skeleton: Moderate to severe cervical disc degeneration from C3-4 to
C5-6.

Other neck: Centrilobular emphysema with partially visualized
ground-glass opacities and interlobular septal thickening compatible
with pulmonary edema described on chest radiograph earlier today.
Dependent opacities in the right upper lobe and superior segments of
the lower lobes may represent atelectasis but are incompletely
evaluated. Endotracheal and enteric tubes are noted, with the
enteric tube partially looped posteriorly in the oropharynx before
continuing inferiorly. A small amount of venous gas in the
supraclavicular regions is likely related to venipuncture. There is
a small amount of fluid in the oropharynx and nasopharynx.

CTA HEAD

Anterior circulation: The internal carotid arteries are patent from
skullbase to carotid termini. There is a moderate to severe stenosis
of the proximal right cavernous ICA with moderate narrowing
involving the anterior genu and proximal supraclinoid ICA on the
right. There is moderate stenosis of the proximal left cavernous
ICA. The right A1 segment is absent. Left A1 is widely patent. M1
segments and MCA bifurcations are widely patent. There is
mild-to-moderate MCA branch vessel irregularity and attenuation
bilaterally. No intracranial aneurysm is identified.

Posterior circulation: Intracranial internal carotid arteries are
patent with the right being mildly dominant and with mild
atherosclerotic irregularity bilaterally without significant
stenosis. The left PICA origin is patent. Right PICA is not well
seen. SCA origins are patent. Basilar artery is patent without
stenosis. Posterior communicating arteries are not identified. The
PCAs are patent with moderate branch vessel irregularity and
narrowing bilaterally but no significant proximal stenosis.

Venous sinuses: Inadequately evaluated due to arterial phase
contrast timing and lack of delayed phase imaging.

Anatomic variants: Absent right A1 segment.
IMPRESSION: 1. No medium or large vessel intracranial arterial occlusion.
2. Moderate to severe right and moderate left cavernous carotid
stenoses.
3. High-grade stenosis (radiographic string sign) of the right
carotid bulb.
4. No significant left-sided cervical carotid artery stenosis.
5. Severe proximal left vertebral artery stenosis.

These results were called by telephone at the time of interpretation
on 05/02/2015 at [DATE] to Dr. Bouzek, who verbally acknowledged
these results.

## 2016-06-02 IMAGING — CT CT HEAD W/O CM
1 of 2 series · 15 of 30 positions shown, 19 images · non-contrast
Comparison: 07/10/2008

ADDENDUM:
The original report was by Dr. Novick Alemany. The following
addendum is by Dr. Novick Alemany:

These results were discussed by telephone at the time of
interpretation on 05/02/2015 at [DATE] to Dr. BSHS NAGLIERI , who
verbally acknowledged these results.
CLINICAL DATA: Code stroke. History of remote strokes. Unable to
move left hand.
EXAM:
CT HEAD WITHOUT CONTRAST
TECHNIQUE: Contiguous axial images were obtained from the base of the skull
through the vertex without intravenous contrast.

[Series 3: head 2.0 h70h · axial · 0.40mm/px · z∈[-150,-10]mm · 15 of 78 slices shown, 19 images]
[im 4/78  brain]
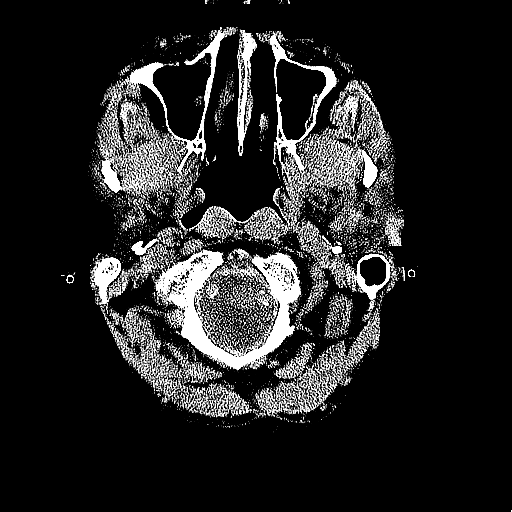
[im 4/78  bone]
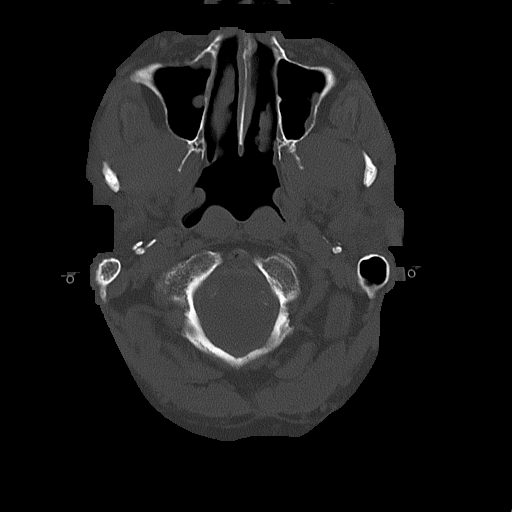
[im 8/78  brain]
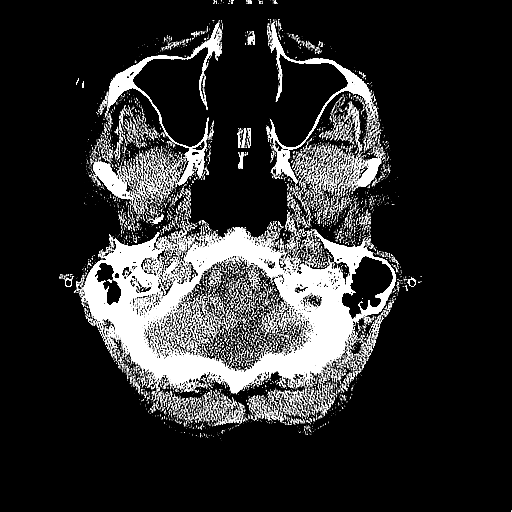
[im 16/78  brain]
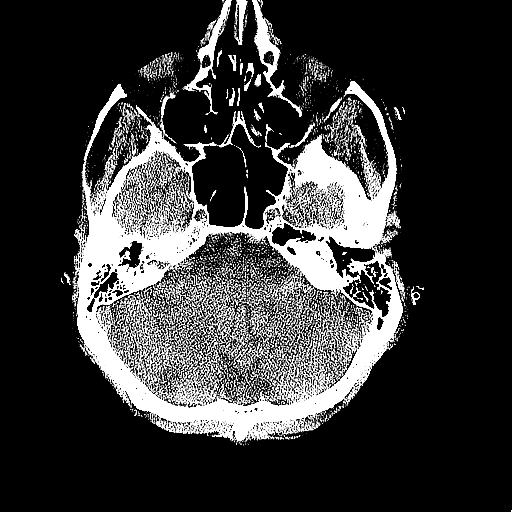
[im 20/78  brain]
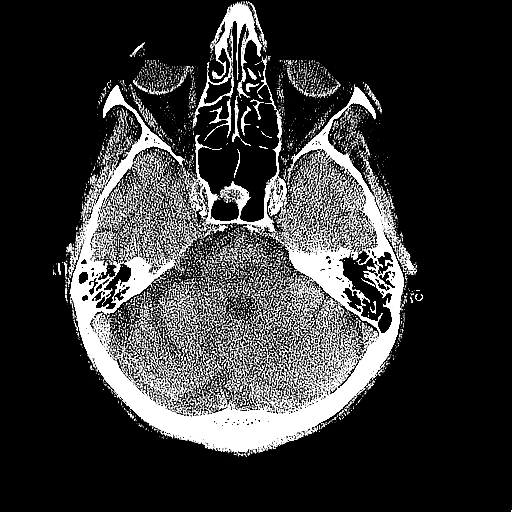
[im 24/78  brain]
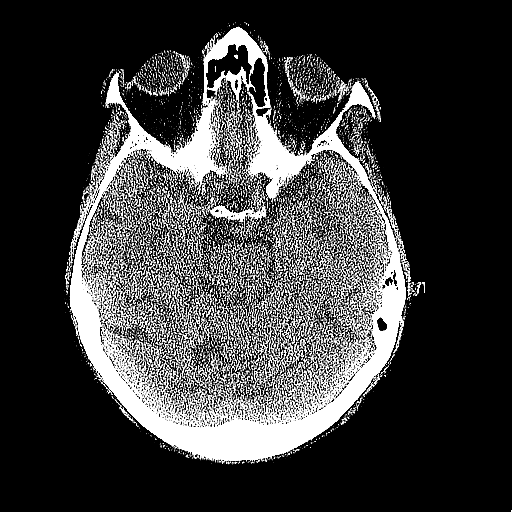
[im 24/78  bone]
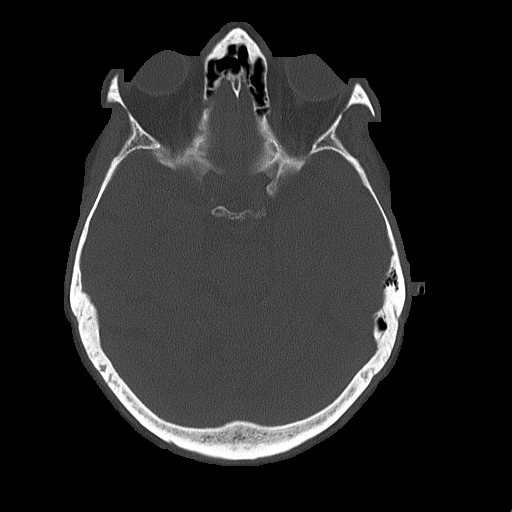
[im 27/78  brain]
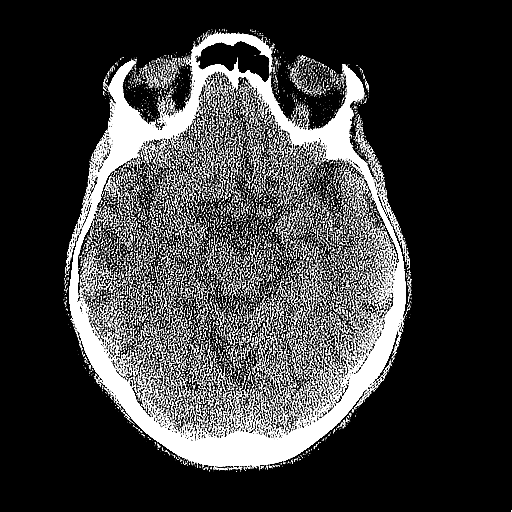
[im 35/78  brain]
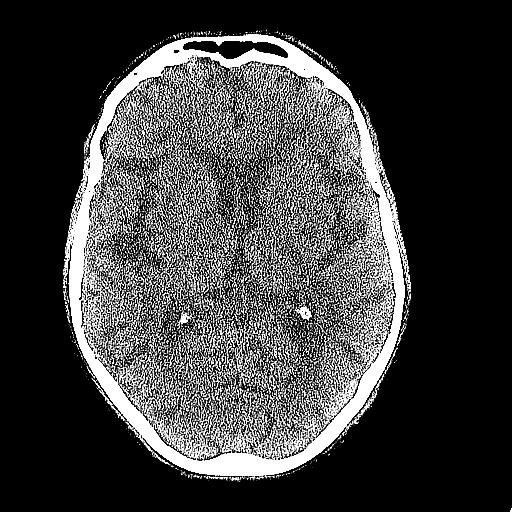
[im 39/78  brain]
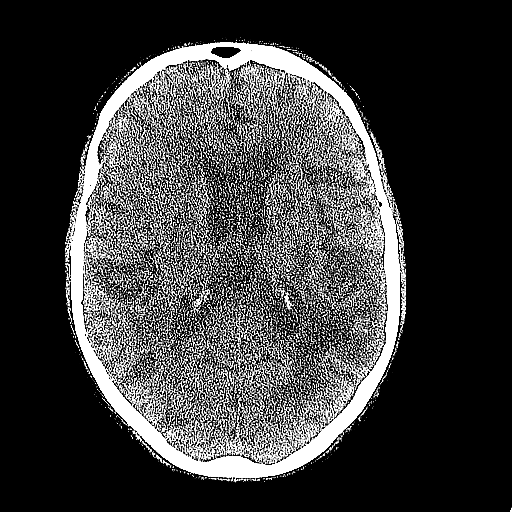
[im 43/78  brain]
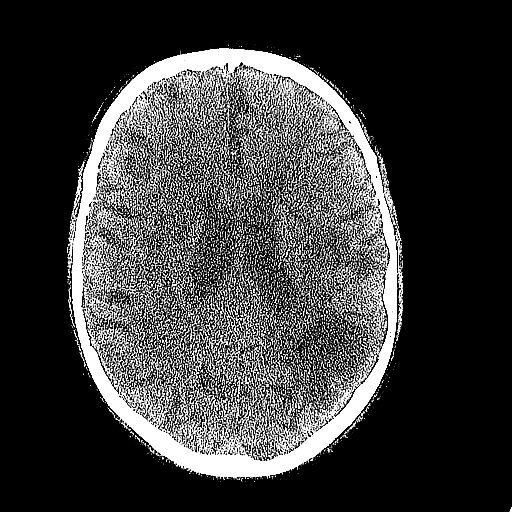
[im 43/78  bone]
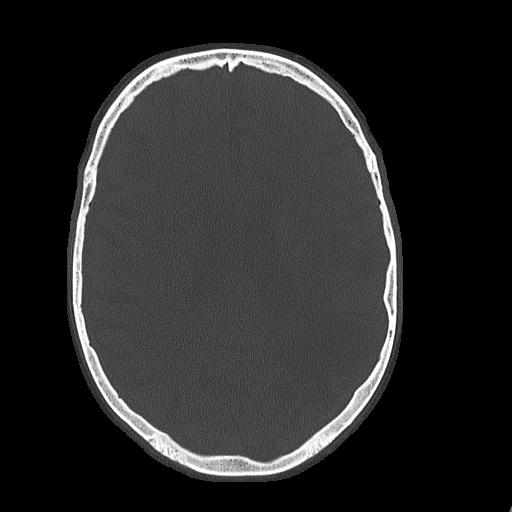
[im 51/78  brain]
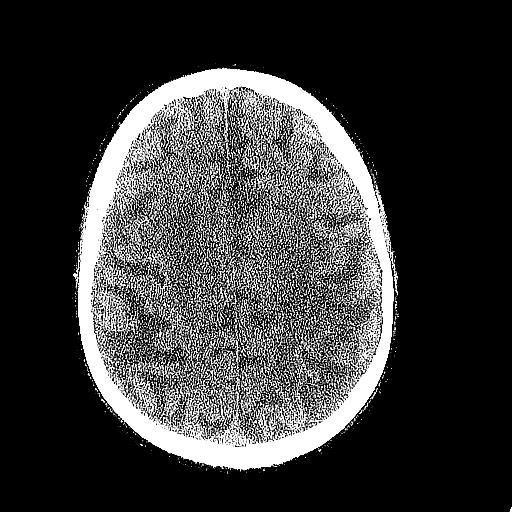
[im 54/78  brain]
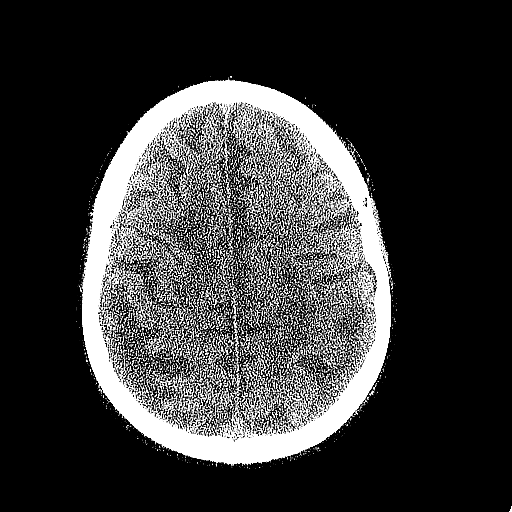
[im 58/78  brain]
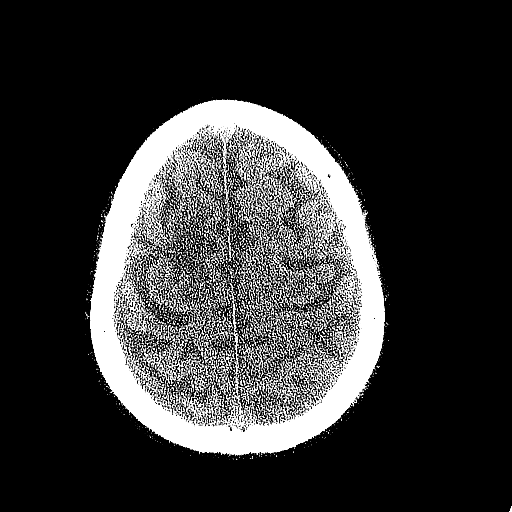
[im 62/78  brain]
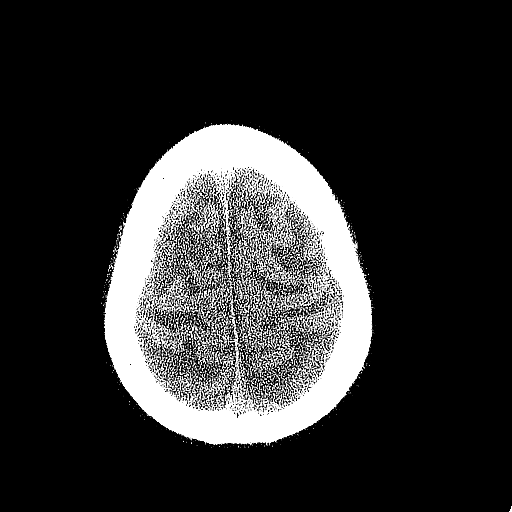
[im 62/78  bone]
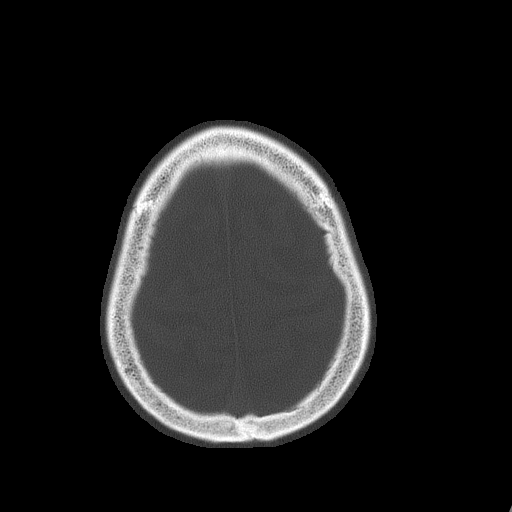
[im 70/78  brain]
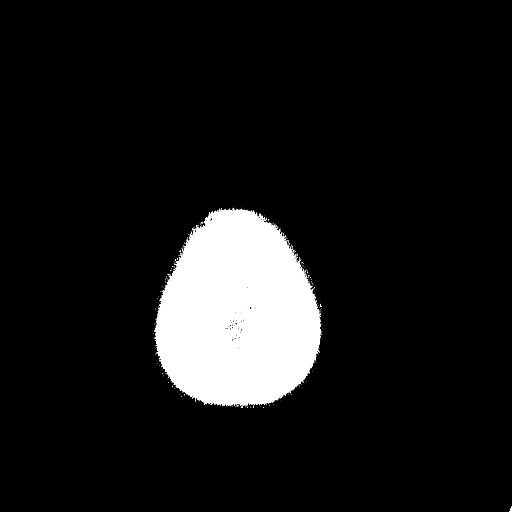
[im 74/78  brain]
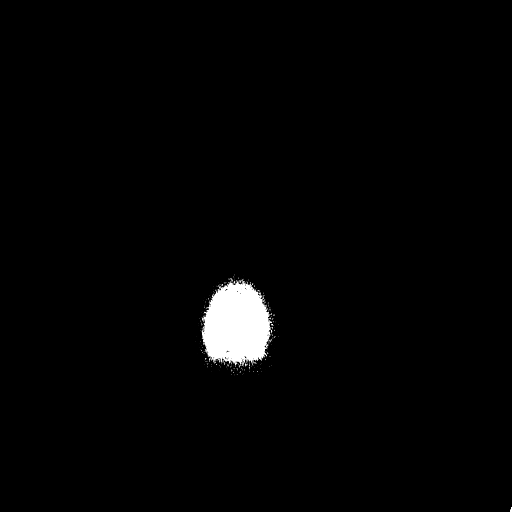

[15 of 30 positions shown; findings below may reference images not displayed]

FINDINGS: Remote bilateral cerebellar lacunar infarcts. Right upper cerebellar
remote infarct, likewise stable from 6676.

Larger left parietal lobe infarct is chronic and similar to 6676.

Scattered small lacunar infarcts in the basal ganglia and external
capsules. Periventricular white matter and corona radiata
hypodensities favor chronic ischemic microvascular white matter
disease. Remote lacunar infarct in the right thalamus.

Remote right frontal lobe infarct, image 24 series 2.

No intracranial hemorrhage, mass lesion, or acute CVA.

Chronic bilateral maxillary sinusitis. There is atherosclerotic
calcification of the cavernous carotid arteries bilaterally.
IMPRESSION: 1. Remote infarcts in the left parietal lobe and right frontal lobe
with scattered remote lacunar infarcts, but no acute intracranial
findings are identified on today's CT scan.
2. Periventricular white matter and corona radiata hypodensities
favor chronic ischemic microvascular white matter disease.
3. Chronic bilateral maxillary sinusitis.
